# Patient Record
Sex: Female | Born: 2005 | Race: Black or African American | Hispanic: No | Marital: Single | Smoking: Never smoker
Health system: Southern US, Community
[De-identification: ages and names within clinical notes are randomized; demographics above are authoritative.]

## PROBLEM LIST (undated history)

## (undated) ENCOUNTER — Inpatient Hospital Stay (HOSPITAL_COMMUNITY): Payer: Self-pay

## (undated) DIAGNOSIS — J302 Other seasonal allergic rhinitis: Secondary | ICD-10-CM

## (undated) HISTORY — PX: MYRINGOTOMY: SUR874

---
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
## 2023-04-19 ENCOUNTER — Other Ambulatory Visit: Payer: Self-pay

## 2023-04-19 ENCOUNTER — Encounter (HOSPITAL_BASED_OUTPATIENT_CLINIC_OR_DEPARTMENT_OTHER): Payer: Self-pay

## 2023-04-19 ENCOUNTER — Emergency Department (HOSPITAL_BASED_OUTPATIENT_CLINIC_OR_DEPARTMENT_OTHER)
Admission: EM | Admit: 2023-04-19 | Discharge: 2023-04-20 | Disposition: A | Discharge status: Home / Self Care | Payer: Medicaid Other | Attending: Emergency Medicine | Admitting: Emergency Medicine

## 2023-04-19 DIAGNOSIS — K5900 Constipation, unspecified: Secondary | ICD-10-CM | POA: Diagnosis not present

## 2023-04-19 DIAGNOSIS — R112 Nausea with vomiting, unspecified: Secondary | ICD-10-CM | POA: Insufficient documentation

## 2023-04-19 LAB — URINALYSIS, ROUTINE W REFLEX MICROSCOPIC
Bilirubin Urine: NEGATIVE
Glucose, UA: NEGATIVE mg/dL
Hgb urine dipstick: NEGATIVE
Ketones, ur: NEGATIVE mg/dL
Leukocytes,Ua: NEGATIVE
Nitrite: NEGATIVE
Protein, ur: NEGATIVE mg/dL
Specific Gravity, Urine: 1.02 (ref 1.005–1.030)
pH: 7 (ref 5.0–8.0)

## 2023-04-19 LAB — COMPREHENSIVE METABOLIC PANEL
ALT: 16 U/L (ref 0–44)
AST: 21 U/L (ref 15–41)
Albumin: 4.1 g/dL (ref 3.5–5.0)
Alkaline Phosphatase: 79 U/L (ref 38–126)
Anion gap: 8 (ref 5–15)
BUN: 10 mg/dL (ref 6–20)
CO2: 24 mmol/L (ref 22–32)
Calcium: 9 mg/dL (ref 8.9–10.3)
Chloride: 102 mmol/L (ref 98–111)
Creatinine, Ser: 0.64 mg/dL (ref 0.44–1.00)
GFR, Estimated: >60 mL/min (ref >60)
Glucose, Bld: 85 mg/dL (ref 70–99)
Potassium: 3.4 mmol/L — ABNORMAL LOW (ref 3.5–5.1)
Sodium: 134 mmol/L — ABNORMAL LOW (ref 135–145)
Total Bilirubin: 0.2 mg/dL — ABNORMAL LOW (ref 0.3–1.2)
Total Protein: 8.1 g/dL (ref 6.5–8.1)

## 2023-04-19 LAB — LIPASE, BLOOD: Lipase: 27 U/L (ref 11–51)

## 2023-04-19 LAB — CBC
HCT: 35.6 % — ABNORMAL LOW (ref 36.0–46.0)
Hemoglobin: 11.1 g/dL — ABNORMAL LOW (ref 12.0–15.0)
MCH: 24.2 pg — ABNORMAL LOW (ref 26.0–34.0)
MCHC: 31.2 g/dL (ref 30.0–36.0)
MCV: 77.6 fL — ABNORMAL LOW (ref 80.0–100.0)
Platelets: 378 10*3/uL (ref 150–400)
RBC: 4.59 MIL/uL (ref 3.87–5.11)
RDW: 15.1 % (ref 11.5–15.5)
WBC: 9.7 10*3/uL (ref 4.0–10.5)
nRBC: 0 % (ref 0.0–0.2)

## 2023-04-19 LAB — PREGNANCY, URINE: Preg Test, Ur: NEGATIVE

## 2023-04-19 MED ORDER — ONDANSETRON 4 MG PO TBDP
4.0000 mg | ORAL_TABLET | Freq: Once | ORAL | Status: AC | PRN
  Administered 2023-04-20: 4 mg via ORAL
  Filled 2023-04-19: qty 1

## 2023-04-19 NOTE — ED Triage Notes (Signed)
Pt has had emesis x 4-5 and stated she had CP after vomiting.

## 2023-04-20 ENCOUNTER — Emergency Department (HOSPITAL_BASED_OUTPATIENT_CLINIC_OR_DEPARTMENT_OTHER): Payer: Medicaid Other

## 2023-04-20 MED ORDER — ONDANSETRON HCL 4 MG PO TABS: 4.0000 mg | ORAL_TABLET | Freq: Three times a day (TID) | ORAL | 0 refills | Status: DC | PRN

## 2023-04-20 MED ORDER — POLYETHYLENE GLYCOL 3350 17 G PO PACK: 17.0000 g | PACK | Freq: Every day | ORAL | 0 refills | Status: DC

## 2023-04-20 NOTE — ED Provider Notes (Signed)
[place] [facility] AT [facility] Provider Note   CSN: [id number] Arrival date & time: 04/19/23  2021     History  Chief Complaint  Patient presents with   Emesis    Morgan Mckenzie is a 17 y.o. female.  The history is provided by the patient.  Emesis Severity:  Moderate Duration:  1 day Timing:  Sporadic Quality:  Stomach contents Progression:  Unchanged Chronicity:  New Recent urination:  Normal Context: not post-tussive   Relieved by:  Nothing Worsened by:  Nothing Ineffective treatments:  None tried Associated symptoms: no abdominal pain, no diarrhea and no fever   Associated symptoms comment:  Constipation  Risk factors: no alcohol use        Home Medications Prior to Admission medications   Medication Sig Start Date End Date Taking? Authorizing Provider  ondansetron (ZOFRAN) 4 MG tablet Take 1 tablet (4 mg total) by mouth every 8 (eight) hours as needed for nausea or vomiting. 04/20/23  Yes Summerlynn Glauser, MD  polyethylene glycol (MIRALAX) 17 g packet Take 17 g by mouth daily. 04/20/23  Yes Kesi Perrow, MD  albuterol (PROVENTIL HFA;VENTOLIN HFA) 108 (90 BASE) MCG/ACT inhaler Inhale 2-4 puffs into the lungs every 6 (six) hours as needed. For shortness of breath and wheezing     [provider]  chlorpheniramine-HYDROcodone (TUSSIONEX PENNKINETIC ER) 10-8 MG/5ML SUER Take 5 mLs by mouth every 12 (twelve) hours as needed. 01/15/16   Molpus, John, MD  fluticasone (FLONASE) 50 MCG/ACT nasal spray Place 1 spray into the nose daily as needed. For seasonal allergies      [provider]  loratadine (CLARITIN) 5 MG/5ML syrup Take 10 mg by mouth daily.      [provider]  norethindrone (MICRONOR) 0.35 MG tablet Take 1 tablet by mouth daily. 05/08/22   [provider]      Allergies    Patient has no known allergies.    Review of Systems   Review of Systems  Constitutional:  Negative for fever.  HENT:   Negative for facial swelling.   Respiratory:  Negative for wheezing and stridor.   Gastrointestinal:  Positive for constipation and vomiting. Negative for abdominal pain and diarrhea.  Genitourinary:  Negative for dysuria and flank pain.  All other systems reviewed and are negative.   Physical Exam Updated Vital Signs BP 138/87   Pulse 94   Temp 99 F (37.2 C) (Oral)   Resp 18   Ht 5\' 3"  (1.6 m)   Wt 89.8 kg   LMP 03/20/2023   SpO2 100%   BMI 35.07 kg/m  Physical Exam Vitals and nursing note reviewed.  Constitutional:      General: She is not in acute distress.    Appearance: Normal appearance. She is well-developed.  HENT:     Head: Normocephalic and atraumatic.     Nose: Nose normal.  Eyes:     Pupils: Pupils are equal, round, and reactive to light.  Cardiovascular:     Rate and Rhythm: Normal rate and regular rhythm.     Pulses: Normal pulses.     Heart sounds: Normal heart sounds.  Pulmonary:     Effort: Pulmonary effort is normal. No respiratory distress.     Breath sounds: Normal breath sounds.  Abdominal:     General: Bowel sounds are normal. There is no distension.     Palpations: Abdomen is soft.     Tenderness: There is no abdominal tenderness. There is  no guarding or rebound.  Genitourinary:    Vagina: No vaginal discharge.  Musculoskeletal:        General: Normal range of motion.     Cervical back: Neck supple.  Skin:    General: Skin is dry.     Capillary Refill: Capillary refill takes less than 2 seconds.     Findings: No erythema or rash.  Neurological:     General: No focal deficit present.     Mental Status: She is alert.     Deep Tendon Reflexes: Reflexes normal.  Psychiatric:        Mood and Affect: Mood normal.     ED Results / Procedures / Treatments   Labs (all labs ordered are listed, but only abnormal results are displayed) Results for orders placed or performed during the hospital encounter of 04/19/23  Lipase, blood  Result  Value Ref Range   Lipase 27 11 - 51 U/L  Comprehensive metabolic panel  Result Value Ref Range   Sodium 134 (L) 135 - 145 mmol/L   Potassium 3.4 (L) 3.5 - 5.1 mmol/L   Chloride 102 98 - 111 mmol/L   CO2 24 22 - 32 mmol/L   Glucose, Bld 85 70 - 99 mg/dL   BUN 10 6 - 20 mg/dL   Creatinine, Ser 1.61 0.44 - 1.00 mg/dL   Calcium 9.0 8.9 - 09.6 mg/dL   Total Protein 8.1 6.5 - 8.1 g/dL   Albumin 4.1 3.5 - 5.0 g/dL   AST 21 15 - 41 U/L   ALT 16 0 - 44 U/L   Alkaline Phosphatase 79 38 - 126 U/L   Total Bilirubin 0.2 (L) 0.3 - 1.2 mg/dL   GFR, Estimated >04 >54 mL/min   Anion gap 8 5 - 15  CBC  Result Value Ref Range   WBC 9.7 4.0 - 10.5 K/uL   RBC 4.59 3.87 - 5.11 MIL/uL   Hemoglobin 11.1 (L) 12.0 - 15.0 g/dL   HCT 09.8 (L) 11.9 - 14.7 %   MCV 77.6 (L) 80.0 - 100.0 fL   MCH 24.2 (L) 26.0 - 34.0 pg   MCHC 31.2 30.0 - 36.0 g/dL   RDW 82.9 56.2 - 13.0 %   Platelets 378 150 - 400 K/uL   nRBC 0.0 0.0 - 0.2 %  Urinalysis, Routine w reflex microscopic -Urine, Clean Catch  Result Value Ref Range   Color, Urine YELLOW YELLOW   APPearance CLEAR CLEAR   Specific Gravity, Urine 1.020 1.005 - 1.030   pH 7.0 5.0 - 8.0   Glucose, UA NEGATIVE NEGATIVE mg/dL   Hgb urine dipstick NEGATIVE NEGATIVE   Bilirubin Urine NEGATIVE NEGATIVE   Ketones, ur NEGATIVE NEGATIVE mg/dL   Protein, ur NEGATIVE NEGATIVE mg/dL   Nitrite NEGATIVE NEGATIVE   Leukocytes,Ua NEGATIVE NEGATIVE  Pregnancy, urine  Result Value Ref Range   Preg Test, Ur NEGATIVE NEGATIVE   DG Abdomen 1 View  Result Date: 04/20/2023 CLINICAL DATA:  Abdominal pain constipation, nausea and vomiting EXAM: ABDOMEN - 1 VIEW COMPARISON:  Report from abdominal radiographs 06/26/2005 FINDINGS: The bowel gas pattern is normal. Moderate to large colonic stool load greatest in the right colon. No radio-opaque calculi or other significant radiographic abnormality are seen. IMPRESSION: Moderate to large colonic stool load greatest in the right colon.  Electronically Signed   By: Minerva Fester M.D.   On: 04/20/2023 01:19     EKG EKG Interpretation  Date/Time:  Saturday Apr 19 2023 20:29:11 EDT Ventricular  Rate:  90 PR Interval:  155 QRS Duration: 78 QT Interval:  331 QTC Calculation: 405 R Axis:   46 Text Interpretation: Sinus rhythm Confirmed by Vivien Rossetti ([id number]) on 04/19/2023 8:34:27 PM  Radiology DG Abdomen 1 View  Result Date: 04/20/2023 CLINICAL DATA:  Abdominal pain constipation, nausea and vomiting EXAM: ABDOMEN - 1 VIEW COMPARISON:  Report from abdominal radiographs 06/26/2005 FINDINGS: The bowel gas pattern is normal. Moderate to large colonic stool load greatest in the right colon. No radio-opaque calculi or other significant radiographic abnormality are seen. IMPRESSION: Moderate to large colonic stool load greatest in the right colon. Electronically Signed   By: Minerva Fester M.D.   On: 04/20/2023 01:19    Procedures Procedures    Medications Ordered in ED Medications  ondansetron (ZOFRAN-ODT) disintegrating tablet 4 mg (4 mg Oral Given 04/20/23 0009)    ED Course/ Medical Decision Making/ A&P                             Medical Decision Making Patient with vomiting and constipation   Amount and/or Complexity of Data Reviewed External Data Reviewed: notes.    Details: Previous notes reviewed  Labs: ordered.    Details: Urine is negative, pregnancy is negative, lipase normal 27, normal white count 9.7, low hemoglobin 11.1 normal platelets. Sodium 134, potassium 3.4 normal creatinine .64, normal LFTs Radiology: ordered and independent interpretation performed.    Details: Severe constipation by me on XR  Risk Prescription drug management. Risk Details: Exam vitals and labs benign and reassuring. Nausea medication given, no further Emesis.  PO challenged successfully in the ED will treat for nausea and constipation  RX for zofran and miralax.  BID miralax x 7 days then daily.      Final Clinical  Impression(s) / ED Diagnoses Final diagnoses:  Nausea and vomiting, unspecified vomiting type  Constipation, unspecified constipation type   Return for intractable cough, coughing up blood, fevers > 100.4 unrelieved by medication, shortness of breath, intractable vomiting, chest pain, shortness of breath, weakness, numbness, changes in speech, facial asymmetry, abdominal pain, passing out, Inability to tolerate liquids or food, cough, altered mental status or any concerns. No signs of systemic illness or infection. The patient is nontoxic-appearing on exam and vital signs are within normal limits.  I have reviewed the triage vital signs and the nursing notes. Pertinent labs & imaging results that were available during my care of the patient were reviewed by me and considered in my medical decision making (see chart for details). After history, exam, and medical workup I feel the patient has been appropriately medically screened and is safe for discharge home. Pertinent diagnoses were discussed with the patient. Patient was given return precautions.  Rx / DC Orders ED Discharge Orders          Ordered    ondansetron (ZOFRAN) 4 MG tablet  Every 8 hours PRN        04/20/23 0111    polyethylene glycol (MIRALAX) 17 g packet  Daily        04/20/23 0111              Skyanne Welle, MD 04/20/23 0136

## 2023-06-02 ENCOUNTER — Encounter (HOSPITAL_BASED_OUTPATIENT_CLINIC_OR_DEPARTMENT_OTHER): Payer: Self-pay | Admitting: Emergency Medicine

## 2023-06-02 ENCOUNTER — Other Ambulatory Visit: Payer: Self-pay

## 2023-06-02 ENCOUNTER — Emergency Department (HOSPITAL_BASED_OUTPATIENT_CLINIC_OR_DEPARTMENT_OTHER)
Admission: EM | Admit: 2023-06-02 | Discharge: 2023-06-02 | Disposition: A | Discharge status: Home / Self Care | Payer: Medicaid Other | Attending: Emergency Medicine | Admitting: Emergency Medicine

## 2023-06-02 DIAGNOSIS — N898 Other specified noninflammatory disorders of vagina: Secondary | ICD-10-CM | POA: Diagnosis present

## 2023-06-02 DIAGNOSIS — B3731 Acute candidiasis of vulva and vagina: Secondary | ICD-10-CM

## 2023-06-02 DIAGNOSIS — Z7951 Long term (current) use of inhaled steroids: Secondary | ICD-10-CM | POA: Insufficient documentation

## 2023-06-02 DIAGNOSIS — J45909 Unspecified asthma, uncomplicated: Secondary | ICD-10-CM | POA: Insufficient documentation

## 2023-06-02 LAB — URINALYSIS, W/ REFLEX TO CULTURE (INFECTION SUSPECTED)
Bilirubin Urine: NEGATIVE
Glucose, UA: NEGATIVE mg/dL
Hgb urine dipstick: NEGATIVE
Ketones, ur: 40 mg/dL — AB
Nitrite: NEGATIVE
Protein, ur: NEGATIVE mg/dL
Specific Gravity, Urine: >=1.03 (ref 1.005–1.030)
pH: 7 (ref 5.0–8.0)

## 2023-06-02 LAB — PREGNANCY, URINE: Preg Test, Ur: NEGATIVE

## 2023-06-02 LAB — WET PREP, GENITAL
Clue Cells Wet Prep HPF POC: NONE SEEN
Sperm: NONE SEEN
Trich, Wet Prep: NONE SEEN
WBC, Wet Prep HPF POC: >=10 — AB (ref <10)

## 2023-06-02 MED ORDER — FLUCONAZOLE 150 MG PO TABS: 150.0000 mg | ORAL_TABLET | Freq: Every day | ORAL | 0 refills | Status: DC

## 2023-06-02 NOTE — ED Provider Notes (Signed)
[place] [facility] Provider Note   CSN: [id number] Arrival date & time: 06/02/23  0254     History  Chief Complaint  Patient presents with   Vaginal Itching    Morgan Mckenzie is a 17 y.o. female.  The history is provided by the patient.  Vaginal Itching  Morgan Mckenzie is a 17 y.o. female who presents to the Emergency Department complaining of vaginal itching.  She presents to the emergency department for evaluation of 2 to 3 days of vaginal itching that she describes as uncomfortable feeling.  At times it burns.  No associated fever, abdominal pain, vomiting, dysuria, vaginal discharge.  No prior similar symptoms.  No new sexual partners.  She tried an over-the-counter urinary relief medication with minimal relief.  She does have some nausea.   Hx/o asthma.  Lmp 6/19.         Home Medications Prior to Admission medications   Medication Sig Start Date End Date Taking? Authorizing Provider  fluconazole (DIFLUCAN) 150 MG tablet Take 1 tablet (150 mg total) by mouth daily. 06/02/23  Yes Tilden Fossa, MD  albuterol (PROVENTIL HFA;VENTOLIN HFA) 108 (90 BASE) MCG/ACT inhaler Inhale 2-4 puffs into the lungs every 6 (six) hours as needed. For shortness of breath and wheezing     [provider]  chlorpheniramine-HYDROcodone (TUSSIONEX PENNKINETIC ER) 10-8 MG/5ML SUER Take 5 mLs by mouth every 12 (twelve) hours as needed. 01/15/16   Molpus, John, MD  fluticasone (FLONASE) 50 MCG/ACT nasal spray Place 1 spray into the nose daily as needed. For seasonal allergies      [provider]  loratadine (CLARITIN) 5 MG/5ML syrup Take 10 mg by mouth daily.      [provider]  norethindrone (MICRONOR) 0.35 MG tablet Take 1 tablet by mouth daily. 05/08/22   [provider]  ondansetron (ZOFRAN) 4 MG tablet Take 1 tablet (4 mg total) by mouth every 8 (eight) hours as needed for nausea or vomiting. 04/20/23   Palumbo, April, MD   polyethylene glycol (MIRALAX) 17 g packet Take 17 g by mouth daily. 04/20/23   Palumbo, April, MD      Allergies    Patient has no known allergies.    Review of Systems   Review of Systems  All other systems reviewed and are negative.   Physical Exam Updated Vital Signs BP (!) 134/91 (BP Location: Right Arm)   Pulse 83   Temp 99.1 F (37.3 C) (Oral)   Resp 16   Ht 5\' 3"  (1.6 m)   Wt 90.7 kg   LMP 05/21/2023 (Exact Date)   SpO2 100%   BMI 35.43 kg/m  Physical Exam Vitals and nursing note reviewed.  Constitutional:      Appearance: She is well-developed.  HENT:     Head: Normocephalic and atraumatic.  Cardiovascular:     Rate and Rhythm: Normal rate and regular rhythm.  Pulmonary:     Effort: Pulmonary effort is normal. No respiratory distress.  Abdominal:     Palpations: Abdomen is soft.     Tenderness: There is no abdominal tenderness. There is no guarding or rebound.  Genitourinary:    Comments: Small amount of thick white discharge.  No CMT.  No external vaginal lesions. Musculoskeletal:        General: No tenderness.  Skin:    General: Skin is warm and dry.  Neurological:     Mental Status: She is alert and oriented to person, place,  and time.  Psychiatric:        Behavior: Behavior normal.     ED Results / Procedures / Treatments   Labs (all labs ordered are listed, but only abnormal results are displayed) Labs Reviewed  WET PREP, GENITAL - Abnormal; Notable for the following components:      Result Value   Yeast Wet Prep HPF POC PRESENT (*)    WBC, Wet Prep HPF POC >=10 (*)    All other components within normal limits  URINALYSIS, W/ REFLEX TO CULTURE (INFECTION SUSPECTED) - Abnormal; Notable for the following components:   Ketones, ur 40 (*)    Leukocytes,Ua TRACE (*)    Bacteria, UA RARE (*)    All other components within normal limits  PREGNANCY, URINE  GC/CHLAMYDIA PROBE AMP ([place]) NOT AT [facility]    EKG None  Radiology No results  found.  Procedures Procedures    Medications Ordered in ED Medications - No data to display  ED Course/ Medical Decision Making/ A&P                             Medical Decision Making Amount and/or Complexity of Data Reviewed Labs: ordered.  Risk Prescription drug management.   Patient here for evaluation of vaginal itching for several days.  She does have a small amount of discharge on examination.  Examination is not consistent with cervicitis/PID.  UA is not consistent with UTI.  She is not pregnant.  Wet prep is significant for yeast.  Discussed with patient findings of candidiasis and treatment.  Also discussed safe sexual practices.  Plan to discharge with PCP follow-up and return precautions.        Final Clinical Impression(s) / ED Diagnoses Final diagnoses:  Vulvovaginal candidiasis    Rx / DC Orders ED Discharge Orders          Ordered    fluconazole (DIFLUCAN) 150 MG tablet  Daily        06/02/23 0440              Tilden Fossa, MD 06/02/23 [phone]

## 2023-06-02 NOTE — ED Triage Notes (Signed)
Pt to ED from home c/o vaginal itching x2-3 days, taking cranberry juice and UTI OTC medication with some relief but states still itching and sensitive to the touch.  Denies bleeding, painful urination, odor, or discharge.

## 2023-06-03 LAB — GC/CHLAMYDIA PROBE AMP (~~LOC~~) NOT AT ARMC
Chlamydia: NEGATIVE
Comment: NEGATIVE
Comment: NORMAL
Neisseria Gonorrhea: NEGATIVE

## 2023-06-19 ENCOUNTER — Other Ambulatory Visit: Payer: Self-pay

## 2023-06-19 ENCOUNTER — Encounter (HOSPITAL_BASED_OUTPATIENT_CLINIC_OR_DEPARTMENT_OTHER): Payer: Self-pay | Admitting: Emergency Medicine

## 2023-06-19 ENCOUNTER — Emergency Department (HOSPITAL_BASED_OUTPATIENT_CLINIC_OR_DEPARTMENT_OTHER)
Admission: EM | Admit: 2023-06-19 | Discharge: 2023-06-19 | Disposition: A | Discharge status: Home / Self Care | Payer: Medicaid Other | Attending: Emergency Medicine | Admitting: Emergency Medicine

## 2023-06-19 ENCOUNTER — Emergency Department (HOSPITAL_BASED_OUTPATIENT_CLINIC_OR_DEPARTMENT_OTHER): Payer: Medicaid Other

## 2023-06-19 DIAGNOSIS — J45909 Unspecified asthma, uncomplicated: Secondary | ICD-10-CM | POA: Insufficient documentation

## 2023-06-19 DIAGNOSIS — W228XXA Striking against or struck by other objects, initial encounter: Secondary | ICD-10-CM | POA: Insufficient documentation

## 2023-06-19 DIAGNOSIS — Z23 Encounter for immunization: Secondary | ICD-10-CM | POA: Insufficient documentation

## 2023-06-19 DIAGNOSIS — S91201A Unspecified open wound of right great toe with damage to nail, initial encounter: Secondary | ICD-10-CM | POA: Diagnosis not present

## 2023-06-19 DIAGNOSIS — Y9301 Activity, walking, marching and hiking: Secondary | ICD-10-CM | POA: Diagnosis not present

## 2023-06-19 DIAGNOSIS — S99921A Unspecified injury of right foot, initial encounter: Secondary | ICD-10-CM | POA: Diagnosis present

## 2023-06-19 DIAGNOSIS — S91209A Unspecified open wound of unspecified toe(s) with damage to nail, initial encounter: Secondary | ICD-10-CM

## 2023-06-19 MED ORDER — IBUPROFEN 800 MG PO TABS
800.0000 mg | ORAL_TABLET | Freq: Once | ORAL | Status: AC
  Administered 2023-06-19: 800 mg via ORAL
  Filled 2023-06-19: qty 1

## 2023-06-19 MED ORDER — ACETAMINOPHEN 325 MG PO TABS: 650.0000 mg | ORAL_TABLET | Freq: Four times a day (QID) | ORAL | 0 refills | Status: DC | PRN

## 2023-06-19 MED ORDER — IBUPROFEN 600 MG PO TABS: 600.0000 mg | ORAL_TABLET | Freq: Four times a day (QID) | ORAL | 0 refills | Status: DC | PRN

## 2023-06-19 MED ORDER — TETANUS-DIPHTH-ACELL PERTUSSIS 5-2.5-18.5 LF-MCG/0.5 IM SUSY
0.5000 mL | PREFILLED_SYRINGE | Freq: Once | INTRAMUSCULAR | Status: AC
  Administered 2023-06-19: 0.5 mL via INTRAMUSCULAR
  Filled 2023-06-19: qty 0.5

## 2023-06-19 NOTE — Discharge Instructions (Addendum)
It was a pleasure caring for you today in the emergency department.  Your toenail may fall off over the next week, please follow up with your pcp for a recheck of the toe. Keep the toe clean/dry until the wound heals. No swimming pools, lakes, streams, hot tubs, baths until the wound has healed to avoid infection.   Please return to the emergency department for any worsening or worrisome symptoms.

## 2023-06-19 NOTE — ED Triage Notes (Signed)
  Patient comes in with R big toe injury that happened around 0100.  Patient states she was drinking and celebrating outside, and accidentally kicked a tree root.  Patient states her R big toenail came all the way back and she pushed it back down.  Bleeding controlled at this time.  Took 500 mg tylenol before arrival.  Pain 9/10, throbbing.

## 2023-06-19 NOTE — ED Provider Notes (Signed)
[place] [facility] Provider Note  CSN: [id number] Arrival date & time: 06/19/23 5284  Chief Complaint(s) Toe Injury  HPI Morgan Mckenzie is a 17 y.o. female with past medical history as below, significant for asthma, seasonal allergies who presents to the ED with complaint of toe injury.  Patient reports she was walking outside open toed shoes, she kicked a tree root with her R hallux.  Feels this dislodged her toenail, significant pain to her left toe after the event.  Unsure last tetanus shot.  She has been ambulatory since the event but does have some pain to her hallux.  No other injury reported, did not fall, no head injury, no LOC, no numbness or tingling to extremities.  No thinners.  Did not clean the wound following the event.  Did take Tylenol prior to arrival.  Normocephalic prior to injury  Past Medical History Past Medical History:  Diagnosis Date   Asthma    Seasonal allergies    There are no problems to display for this patient.  Home Medication(s) Prior to Admission medications   Medication Sig Start Date End Date Taking? Authorizing Provider  acetaminophen (TYLENOL) 325 MG tablet Take 2 tablets (650 mg total) by mouth every 6 (six) hours as needed. 06/19/23  Yes Tanda Rockers A, DO  ibuprofen (ADVIL) 600 MG tablet Take 1 tablet (600 mg total) by mouth every 6 (six) hours as needed. 06/19/23  Yes Tanda Rockers A, DO  albuterol (PROVENTIL HFA;VENTOLIN HFA) 108 (90 BASE) MCG/ACT inhaler Inhale 2-4 puffs into the lungs every 6 (six) hours as needed. For shortness of breath and wheezing     [provider]  chlorpheniramine-HYDROcodone (TUSSIONEX PENNKINETIC ER) 10-8 MG/5ML SUER Take 5 mLs by mouth every 12 (twelve) hours as needed. 01/15/16   Molpus, John, MD  fluconazole (DIFLUCAN) 150 MG tablet Take 1 tablet (150 mg total) by mouth daily. 06/02/23   Tilden Fossa, MD  fluticasone [facility]) 50 MCG/ACT nasal spray Place 1 spray into  the nose daily as needed. For seasonal allergies      [provider]  loratadine (CLARITIN) 5 MG/5ML syrup Take 10 mg by mouth daily.      [provider]  norethindrone (MICRONOR) 0.35 MG tablet Take 1 tablet by mouth daily. 05/08/22   [provider]  ondansetron (ZOFRAN) 4 MG tablet Take 1 tablet (4 mg total) by mouth every 8 (eight) hours as needed for nausea or vomiting. 04/20/23   Palumbo, April, MD  polyethylene glycol (MIRALAX) 17 g packet Take 17 g by mouth daily. 04/20/23   Palumbo, April, MD                                                                                                                                    Past Surgical History Past Surgical History:  Procedure Laterality Date   MYRINGOTOMY     Family  History History reviewed. No pertinent family history.  Social History Social History   Tobacco Use   Smoking status: Never    Passive exposure: Yes  Substance Use Topics   Alcohol use: Never   Drug use: Never   Allergies Patient has no known allergies.  Review of Systems Review of Systems  Respiratory:  Negative for shortness of breath.   Cardiovascular:  Negative for chest pain.  Gastrointestinal:  Negative for abdominal pain, nausea and vomiting.  Musculoskeletal:  Positive for arthralgias.  Skin:  Positive for wound.  Neurological:  Negative for syncope and headaches.  All other systems reviewed and are negative.   Physical Exam Vital Signs  I have reviewed the triage vital signs BP (!) 135/119 (BP Location: Right Arm)   Pulse (!) 107   Temp 98.1 F (36.7 C) (Oral)   Resp 20   Ht 5\' 3"  (1.6 m)   Wt 90.7 kg   LMP 06/19/2023 (Exact Date)   SpO2 100%   BMI 35.43 kg/m  Physical Exam Vitals and nursing note reviewed.  Constitutional:      General: She is not in acute distress.    Appearance: Normal appearance. She is well-developed. She is obese. She is not ill-appearing.  HENT:     Head: Normocephalic and  atraumatic.     Right Ear: External ear normal.     Left Ear: External ear normal.     Nose: Nose normal.     Mouth/Throat:     Mouth: Mucous membranes are moist.  Eyes:     General: No scleral icterus.       Right eye: No discharge.        Left eye: No discharge.  Cardiovascular:     Rate and Rhythm: Normal rate.  Pulmonary:     Effort: Pulmonary effort is normal. No respiratory distress.     Breath sounds: No stridor.  Abdominal:     General: Abdomen is flat. There is no distension.     Tenderness: There is no guarding.  Musculoskeletal:        General: No deformity.     Cervical back: No rigidity.       Feet:  Feet:     Comments: Partial avulsion of right hallux Toe is TTP No frank bleeding, does appear to be some dirt under the toenail Cap refill is brisk distal to injury DP brisk No laceration Skin:    General: Skin is warm and dry.     Coloration: Skin is not cyanotic, jaundiced or pale.  Neurological:     Mental Status: She is alert and oriented to person, place, and time.     GCS: GCS eye subscore is 4. GCS verbal subscore is 5. GCS motor subscore is 6.  Psychiatric:        Speech: Speech normal.        Behavior: Behavior normal. Behavior is cooperative.     ED Results and Treatments Labs (all labs ordered are listed, but only abnormal results are displayed) Labs Reviewed - No data to display  Radiology DG Toe Great Right  Result Date: 06/19/2023 CLINICAL DATA:  Big toe injury affecting the toenail. EXAM: RIGHT GREAT TOE three views. COMPARISON:  None Available. FINDINGS: Up lifted toenail with subjacent gas. No underlying fracture or opaque foreign body. IMPRESSION: Nail injury without fracture or opaque foreign body. Electronically Signed   By: Tiburcio Pea M.D.   On: 06/19/2023 05:58    Pertinent labs & imaging results that were  available during my care of the patient were reviewed by me and considered in my medical decision making (see MDM for details).  Medications Ordered in ED Medications  Tdap (BOOSTRIX) injection 0.5 mL (has no administration in time range)  ibuprofen (ADVIL) tablet 800 mg (800 mg Oral Given 06/19/23 0438)                                                                                                                                     Procedures Procedures  (including critical care time)  Medical Decision Making / ED Course    Medical Decision Making:    Morgan Mckenzie is a 17 y.o. female history as above including asthma here with toe injury. The complaint involves an extensive differential diagnosis and also carries with it a high risk of complications and morbidity.  Serious etiology was considered. Ddx includes but is not limited to: Fracture, abrasion, soft tissue injury, toenail avulsion, etc.  Complete initial physical exam performed, notably the patient  was NAD, sitting upright on stretcher with friend.    Reviewed and confirmed nursing documentation for past medical history, family history, social history.  Vital signs reviewed.    Clinical Course as of 06/19/23 1610  Thu Jun 19, 2023  9604 Give Motrin, update tetanus, clean wound, get x-ray. [SG]  0602 Xr w/o fx, agree w/ radiologist  [SG]    Clinical Course User Index [SG] Sloan Leiter, DO   She has toenail partial avulsion, no fx Wound care ordered, update tetanus Post op shoe Close pcp f/u, may need to remove toenail, defer to pcp  Wound care instructions provided to pt along with strict return precautions  The patient improved significantly and was discharged in stable condition. Detailed discussions were had with the patient regarding current findings, and need for close f/u with PCP or on call doctor. The patient has been instructed to return immediately if the symptoms worsen in any way for re-evaluation.  Patient verbalized understanding and is in agreement with current care plan. All questions answered prior to discharge.         Additional history obtained: -Additional history obtained from friend -External records from outside source obtained and reviewed including: Chart review including previous notes, labs, imaging, consultation notes including prior ED visits, prior labs and imaging, home medications   Lab Tests: N/A  EKG   EKG Interpretation Date/Time:    Ventricular Rate:    PR Interval:    QRS  Duration:    QT Interval:    QTC Calculation:   R Axis:      Text Interpretation:           Imaging Studies ordered: I ordered imaging studies including toe x-ray I independently visualized the following imaging with scope of interpretation limited to determining acute life threatening conditions related to emergency care; findings noted above, significant for no fx, agree w/ rads I independently visualized and interpreted imaging. I agree with the radiologist interpretation   Medicines ordered and prescription drug management: Meds ordered this encounter  Medications   Tdap (BOOSTRIX) injection 0.5 mL   ibuprofen (ADVIL) tablet 800 mg   ibuprofen (ADVIL) 600 MG tablet    Sig: Take 1 tablet (600 mg total) by mouth every 6 (six) hours as needed.    Dispense:  30 tablet    Refill:  0   acetaminophen (TYLENOL) 325 MG tablet    Sig: Take 2 tablets (650 mg total) by mouth every 6 (six) hours as needed.    Dispense:  36 tablet    Refill:  0    -I have reviewed the patients home medicines and have made adjustments as needed   Consultations Obtained: N/A  Cardiac Monitoring: Not applicable  Social Determinants of Health:  Diagnosis or treatment significantly limited by social determinants of health: obesity, former smoker   Reevaluation: After the interventions noted above, I reevaluated the patient and found that they have improved  Co morbidities that  complicate the patient evaluation  Past Medical History:  Diagnosis Date   Asthma    Seasonal allergies       Dispostion: Disposition decision including need for hospitalization was considered, and patient discharged from emergency department.    Final Clinical Impression(s) / ED Diagnoses Final diagnoses:  Avulsion of toenail, initial encounter     This chart was dictated using voice recognition software.  Despite best efforts to proofread,  errors can occur which can change the documentation meaning.    Sloan Leiter, DO 06/19/23 [phone]

## 2023-08-14 ENCOUNTER — Other Ambulatory Visit: Payer: Self-pay

## 2023-08-14 ENCOUNTER — Encounter (HOSPITAL_BASED_OUTPATIENT_CLINIC_OR_DEPARTMENT_OTHER): Payer: Self-pay | Admitting: Emergency Medicine

## 2023-08-14 DIAGNOSIS — R109 Unspecified abdominal pain: Secondary | ICD-10-CM | POA: Diagnosis present

## 2023-08-14 DIAGNOSIS — R112 Nausea with vomiting, unspecified: Secondary | ICD-10-CM | POA: Insufficient documentation

## 2023-08-14 DIAGNOSIS — R1031 Right lower quadrant pain: Secondary | ICD-10-CM | POA: Insufficient documentation

## 2023-08-14 LAB — COMPREHENSIVE METABOLIC PANEL
ALT: 11 U/L (ref 0–44)
AST: 17 U/L (ref 15–41)
Albumin: 3.7 g/dL (ref 3.5–5.0)
Alkaline Phosphatase: 66 U/L (ref 38–126)
Anion gap: 9 (ref 5–15)
BUN: 11 mg/dL (ref 6–20)
CO2: 23 mmol/L (ref 22–32)
Calcium: 8.9 mg/dL (ref 8.9–10.3)
Chloride: 105 mmol/L (ref 98–111)
Creatinine, Ser: 0.72 mg/dL (ref 0.44–1.00)
GFR, Estimated: >60 mL/min (ref >60)
Glucose, Bld: 115 mg/dL — ABNORMAL HIGH (ref 70–99)
Potassium: 3.6 mmol/L (ref 3.5–5.1)
Sodium: 137 mmol/L (ref 135–145)
Total Bilirubin: 0.2 mg/dL — ABNORMAL LOW (ref 0.3–1.2)
Total Protein: 7.5 g/dL (ref 6.5–8.1)

## 2023-08-14 LAB — CBC
HCT: 33.4 % — ABNORMAL LOW (ref 36.0–46.0)
Hemoglobin: 10.5 g/dL — ABNORMAL LOW (ref 12.0–15.0)
MCH: 24.2 pg — ABNORMAL LOW (ref 26.0–34.0)
MCHC: 31.4 g/dL (ref 30.0–36.0)
MCV: 77 fL — ABNORMAL LOW (ref 80.0–100.0)
Platelets: 374 10*3/uL (ref 150–400)
RBC: 4.34 MIL/uL (ref 3.87–5.11)
RDW: 15 % (ref 11.5–15.5)
WBC: 8.8 10*3/uL (ref 4.0–10.5)
nRBC: 0 % (ref 0.0–0.2)

## 2023-08-14 LAB — URINALYSIS, ROUTINE W REFLEX MICROSCOPIC
Glucose, UA: NEGATIVE mg/dL
Hgb urine dipstick: NEGATIVE
Ketones, ur: NEGATIVE mg/dL
Leukocytes,Ua: NEGATIVE
Nitrite: NEGATIVE
Protein, ur: NEGATIVE mg/dL
Specific Gravity, Urine: >=1.03 (ref 1.005–1.030)
pH: 5.5 (ref 5.0–8.0)

## 2023-08-14 LAB — LIPASE, BLOOD: Lipase: 23 U/L (ref 11–51)

## 2023-08-14 LAB — PREGNANCY, URINE: Preg Test, Ur: NEGATIVE

## 2023-08-14 NOTE — ED Triage Notes (Signed)
Lower right side cramping, has tried  OTC meds with no relief. Denies injury or Hx of same

## 2023-08-15 ENCOUNTER — Ambulatory Visit (HOSPITAL_BASED_OUTPATIENT_CLINIC_OR_DEPARTMENT_OTHER): Payer: Medicaid Other

## 2023-08-15 ENCOUNTER — Emergency Department (HOSPITAL_BASED_OUTPATIENT_CLINIC_OR_DEPARTMENT_OTHER)
Admission: EM | Admit: 2023-08-15 | Discharge: 2023-08-15 | Disposition: A | Discharge status: Home / Self Care | Payer: Medicaid Other | Attending: Emergency Medicine | Admitting: Emergency Medicine

## 2023-08-15 ENCOUNTER — Emergency Department (HOSPITAL_BASED_OUTPATIENT_CLINIC_OR_DEPARTMENT_OTHER): Payer: Medicaid Other

## 2023-08-15 ENCOUNTER — Encounter (HOSPITAL_BASED_OUTPATIENT_CLINIC_OR_DEPARTMENT_OTHER): Payer: Self-pay | Admitting: Radiology

## 2023-08-15 ENCOUNTER — Ambulatory Visit (HOSPITAL_BASED_OUTPATIENT_CLINIC_OR_DEPARTMENT_OTHER)
Admission: RE | Admit: 2023-08-15 | Discharge: 2023-08-15 | Disposition: A | Discharge status: Home / Self Care | Payer: Medicaid Other | Source: Ambulatory Visit | Attending: Emergency Medicine | Admitting: Emergency Medicine

## 2023-08-15 DIAGNOSIS — R1031 Right lower quadrant pain: Secondary | ICD-10-CM

## 2023-08-15 MED ORDER — MORPHINE SULFATE (PF) 4 MG/ML IV SOLN
4.0000 mg | Freq: Once | INTRAVENOUS | Status: AC
  Administered 2023-08-15: 4 mg via INTRAVENOUS
  Filled 2023-08-15: qty 1

## 2023-08-15 MED ORDER — ONDANSETRON 4 MG PO TBDP: 4.0000 mg | ORAL_TABLET | Freq: Three times a day (TID) | ORAL | 0 refills | Status: DC | PRN

## 2023-08-15 MED ORDER — IOHEXOL 300 MG/ML  SOLN
100.0000 mL | Freq: Once | INTRAMUSCULAR | Status: AC | PRN
  Administered 2023-08-15: 100 mL via INTRAVENOUS

## 2023-08-15 MED ORDER — ONDANSETRON HCL 4 MG/2ML IJ SOLN
4.0000 mg | Freq: Once | INTRAMUSCULAR | Status: AC
  Administered 2023-08-15: 4 mg via INTRAVENOUS
  Filled 2023-08-15: qty 2

## 2023-08-15 MED ORDER — SODIUM CHLORIDE 0.9 % IV BOLUS
1000.0000 mL | Freq: Once | INTRAVENOUS | Status: AC
  Administered 2023-08-15: 1000 mL via INTRAVENOUS

## 2023-08-15 NOTE — ED Provider Notes (Signed)
[facility]PARTMENT AT [facility] Provider Note   CSN: [id number] Arrival date & time: 08/14/23  2305     History  Chief Complaint  Patient presents with   Abdominal Pain    Morgan Mckenzie is a 17 y.o. female.  Patient with right sided abdominal pain worsening for the past 2 days.  Associate with nausea but no vomiting.  No constipation.  No pain with urination or blood in the urine.  Nothing makes the pain better.  Worse with pushing on the area.  Poor appetite.  No chest pain or shortness of breath.  No cough, runny nose or sore throat.  No fever.  No pain with urination or blood in the urine.  Still has appendix and gallbladder. No vaginal bleeding or discharge. No history of similar pain in the past.  No injury.  The history is provided by the patient.  Abdominal Pain Associated symptoms: nausea and vomiting   Associated symptoms: no cough, no dysuria, no fever, no hematuria and no shortness of breath        Home Medications Prior to Admission medications   Medication Sig Start Date End Date Taking? Authorizing Provider  acetaminophen (TYLENOL) 325 MG tablet Take 2 tablets (650 mg total) by mouth every 6 (six) hours as needed. 06/19/23   Sloan Leiter, DO  albuterol (PROVENTIL HFA;VENTOLIN HFA) 108 (90 BASE) MCG/ACT inhaler Inhale 2-4 puffs into the lungs every 6 (six) hours as needed. For shortness of breath and wheezing     [provider]  chlorpheniramine-HYDROcodone (TUSSIONEX PENNKINETIC ER) 10-8 MG/5ML SUER Take 5 mLs by mouth every 12 (twelve) hours as needed. 01/15/16   Molpus, John, MD  fluconazole (DIFLUCAN) 150 MG tablet Take 1 tablet (150 mg total) by mouth daily. 06/02/23   Tilden Fossa, MD  fluticasone [facility]) 50 MCG/ACT nasal spray Place 1 spray into the nose daily as needed. For seasonal allergies      [provider]  ibuprofen (ADVIL) 600 MG tablet Take 1 tablet (600 mg total) by mouth every 6 (six) hours as  needed. 06/19/23   Sloan Leiter, DO  loratadine (CLARITIN) 5 MG/5ML syrup Take 10 mg by mouth daily.      [provider]  norethindrone (MICRONOR) 0.35 MG tablet Take 1 tablet by mouth daily. 05/08/22   [provider]  ondansetron (ZOFRAN) 4 MG tablet Take 1 tablet (4 mg total) by mouth every 8 (eight) hours as needed for nausea or vomiting. 04/20/23   Palumbo, April, MD  polyethylene glycol (MIRALAX) 17 g packet Take 17 g by mouth daily. 04/20/23   Palumbo, April, MD      Allergies    Patient has no known allergies.    Review of Systems   Review of Systems  Constitutional:  Positive for activity change and appetite change. Negative for fever.  HENT:  Negative for congestion and rhinorrhea.   Respiratory:  Negative for cough and shortness of breath.   Gastrointestinal:  Positive for abdominal pain, nausea and vomiting.  Genitourinary:  Negative for dysuria and hematuria.  Musculoskeletal:  Negative for arthralgias, back pain and myalgias.  Skin:  Negative for rash.  Neurological:  Negative for dizziness, weakness and headaches.   all other systems are negative except as noted in the HPI and PMH.    Physical Exam Updated Vital Signs BP 128/66 (BP Location: Right Arm)   Pulse 94   Temp 98.8 F (37.1 C) (Oral)   Resp 14  Ht 5\' 3"  (1.6 m)   Wt 90.7 kg   LMP 07/24/2023 (Approximate)   SpO2 100%   BMI 35.43 kg/m  Physical Exam Vitals and nursing note reviewed.  Constitutional:      General: She is not in acute distress.    Appearance: She is well-developed.  HENT:     Head: Normocephalic and atraumatic.     Mouth/Throat:     Pharynx: No oropharyngeal exudate.  Eyes:     Conjunctiva/sclera: Conjunctivae normal.     Pupils: Pupils are equal, round, and reactive to light.  Neck:     Comments: No meningismus. Cardiovascular:     Rate and Rhythm: Normal rate and regular rhythm.     Heart sounds: Normal heart sounds. No murmur heard. Pulmonary:     Effort:  Pulmonary effort is normal. No respiratory distress.     Breath sounds: Normal breath sounds.  Abdominal:     Palpations: Abdomen is soft.     Tenderness: There is abdominal tenderness. There is guarding. There is no rebound.     Comments: Tender right lower quadrant with voluntary guarding.  Musculoskeletal:        General: No tenderness. Normal range of motion.     Cervical back: Normal range of motion and neck supple.  Skin:    General: Skin is warm.  Neurological:     Mental Status: She is alert and oriented to person, place, and time.     Cranial Nerves: No cranial nerve deficit.     Motor: No abnormal muscle tone.     Coordination: Coordination normal.     Comments:  5/5 strength throughout. CN 2-12 intact.Equal grip strength.   Psychiatric:        Behavior: Behavior normal.     ED Results / Procedures / Treatments   Labs (all labs ordered are listed, but only abnormal results are displayed) Labs Reviewed  COMPREHENSIVE METABOLIC PANEL - Abnormal; Notable for the following components:      Result Value   Glucose, Bld 115 (*)    Total Bilirubin 0.2 (*)    All other components within normal limits  CBC - Abnormal; Notable for the following components:   Hemoglobin 10.5 (*)    HCT 33.4 (*)    MCV 77.0 (*)    MCH 24.2 (*)    All other components within normal limits  URINALYSIS, ROUTINE W REFLEX MICROSCOPIC - Abnormal; Notable for the following components:   APPearance HAZY (*)    Bilirubin Urine SMALL (*)    All other components within normal limits  LIPASE, BLOOD  PREGNANCY, URINE    EKG None  Radiology CT ABDOMEN PELVIS W CONTRAST  Result Date: 08/15/2023 CLINICAL DATA:  Right lower quadrant abdominal pain EXAM: CT ABDOMEN AND PELVIS WITH CONTRAST TECHNIQUE: Multidetector CT imaging of the abdomen and pelvis was performed using the standard protocol following bolus administration of intravenous contrast. RADIATION DOSE REDUCTION: This exam was performed  according to the departmental dose-optimization program which includes automated exposure control, adjustment of the mA and/or kV according to patient size and/or use of iterative reconstruction technique. CONTRAST:  OMNIPAQUE IOHEXOL 300 MG/ML  SOLN COMPARISON:  None Available. FINDINGS: Lower chest: Right lower quadrant abdominal pain Hepatobiliary: Heterogeneous attenuation of the liver with poorly circumscribed hypoenhancement of segment 4 likely relates to geographic fatty hepatic infiltration. Superimposed small focus of hyperenhancement within segment 4 abutting the gallbladder fossa at axial image # 21/301 likely represents a small flash fill  hemangioma or area of vascular shunting. The liver is otherwise unremarkable. Gallbladder unremarkable. No intra or extrahepatic biliary ductal dilation. Pancreas: Unremarkable Spleen: Unremarkable Adrenals/Urinary Tract: Adrenal glands are unremarkable. Kidneys are normal, without renal calculi, focal lesion, or hydronephrosis. Bladder is unremarkable. Stomach/Bowel: Stomach is within normal limits. Appendix appears normal. No evidence of bowel wall thickening, distention, or inflammatory changes. Vascular/Lymphatic: No significant vascular findings are present. No enlarged abdominal or pelvic lymph nodes. Reproductive: Uterus and bilateral adnexa are unremarkable. Other: No abdominal wall hernia or abnormality. No abdominopelvic ascites. Musculoskeletal: No acute or significant osseous findings. IMPRESSION: 1. No acute intra-abdominal pathology identified. Normal appendix. No definite radiographic explanation for the patient's reported symptoms. 2. Heterogeneous attenuation of the liver with poorly circumscribed hypoenhancement of segment 4 likely relates to geographic fatty hepatic infiltration. Superimposed small focus of hyperenhancement within segment 4 abutting the gallbladder fossa likely represents a small flash fill hemangioma or area of vascular  shunting. If desired, this could be confirmed with nonemergent, outpatient hepatic protocol MRI examination. Electronically Signed   By: Helyn Numbers M.D.   On: 08/15/2023 01:56    Procedures Procedures    Medications Ordered in ED Medications  sodium chloride 0.9 % bolus 1,000 mL (has no administration in time range)  ondansetron (ZOFRAN) injection 4 mg (has no administration in time range)  morphine (PF) 4 MG/ML injection 4 mg (has no administration in time range)    ED Course/ Medical Decision Making/ A&P                                 Medical Decision Making Amount and/or Complexity of Data Reviewed Labs: ordered. Decision-making details documented in ED Course. Radiology: ordered and independent interpretation performed. Decision-making details documented in ED Course. ECG/medicine tests: ordered and independent interpretation performed. Decision-making details documented in ED Course.  Risk Prescription drug management.   2 days of right-sided lower abdominal tenderness.  Nausea but no vomiting.  Vital stable, no distress, abdomen soft without peritoneal signs.  hCG is negative.  Urinalysis is negative No urinary or vaginal symptoms.  Labs show stable anemia.  CT scan is obtained to assess for appendicitis.  This shows normal appendix and normal ovary.  Does show apparent vascular abnormality of liver which patient is made aware of with need for MRI follow-up.  Will have patient return later today for an ultrasound of her right ovary.  Low suspicion for ovarian torsion at this time as she is very comfortable and smiling on the phone.  Discussed appendicitis may not show up on CT scan right away.  She should return to the ED with worsening right-sided lower abdominal pain, fever or vomiting.  Return later today for ultrasound of ovary.  Return sooner with worsening pain, fever, vomiting, unable to eat or drink or any other concerns.       Final Clinical  Impression(s) / ED Diagnoses Final diagnoses:  RLQ abdominal pain    Rx / DC Orders ED Discharge Orders     None         Angelica Wix, Jeannett Senior, MD 08/15/23 [phone]

## 2023-08-15 NOTE — ED Notes (Signed)
Patient tolerated fluid challenge without complaints.

## 2023-08-15 NOTE — Discharge Instructions (Addendum)
Your testing today shows a normal appendix.  CT scan did show an abnormality of your liver which is likely a normal vascular variant but your doctor should schedule an MRI of your liver for further evaluation.  Return later today for an ultrasound of your ovaries to assess for ovarian cyst or blood supply problems to your right ovary. As we discussed, appendicitis may not show up on CT scan right away.  Return to the ED for worsening lower abdominal pain, fever, vomiting, not able to eat or drink or other concerns.

## 2023-08-29 ENCOUNTER — Encounter (HOSPITAL_BASED_OUTPATIENT_CLINIC_OR_DEPARTMENT_OTHER): Payer: Self-pay

## 2023-08-29 ENCOUNTER — Emergency Department (HOSPITAL_BASED_OUTPATIENT_CLINIC_OR_DEPARTMENT_OTHER)
Admission: EM | Admit: 2023-08-29 | Discharge: 2023-08-29 | Disposition: A | Discharge status: Home / Self Care | Payer: Medicaid Other | Attending: Emergency Medicine | Admitting: Emergency Medicine

## 2023-08-29 ENCOUNTER — Other Ambulatory Visit: Payer: Self-pay

## 2023-08-29 DIAGNOSIS — M898X8 Other specified disorders of bone, other site: Secondary | ICD-10-CM

## 2023-08-29 DIAGNOSIS — M7621 Iliac crest spur, right hip: Secondary | ICD-10-CM | POA: Diagnosis not present

## 2023-08-29 DIAGNOSIS — J45909 Unspecified asthma, uncomplicated: Secondary | ICD-10-CM | POA: Diagnosis not present

## 2023-08-29 DIAGNOSIS — R1031 Right lower quadrant pain: Secondary | ICD-10-CM | POA: Diagnosis not present

## 2023-08-29 DIAGNOSIS — E876 Hypokalemia: Secondary | ICD-10-CM | POA: Insufficient documentation

## 2023-08-29 DIAGNOSIS — R109 Unspecified abdominal pain: Secondary | ICD-10-CM | POA: Diagnosis present

## 2023-08-29 LAB — CBC
HCT: 32.2 % — ABNORMAL LOW (ref 36.0–46.0)
Hemoglobin: 10 g/dL — ABNORMAL LOW (ref 12.0–15.0)
MCH: 24.1 pg — ABNORMAL LOW (ref 26.0–34.0)
MCHC: 31.1 g/dL (ref 30.0–36.0)
MCV: 77.6 fL — ABNORMAL LOW (ref 80.0–100.0)
Platelets: 362 10*3/uL (ref 150–400)
RBC: 4.15 MIL/uL (ref 3.87–5.11)
RDW: 15.5 % (ref 11.5–15.5)
WBC: 7.5 10*3/uL (ref 4.0–10.5)
nRBC: 0 % (ref 0.0–0.2)

## 2023-08-29 LAB — COMPREHENSIVE METABOLIC PANEL
ALT: 12 U/L (ref 0–44)
AST: 19 U/L (ref 15–41)
Albumin: 3.7 g/dL (ref 3.5–5.0)
Alkaline Phosphatase: 63 U/L (ref 38–126)
Anion gap: 8 (ref 5–15)
BUN: 11 mg/dL (ref 6–20)
CO2: 23 mmol/L (ref 22–32)
Calcium: 8.6 mg/dL — ABNORMAL LOW (ref 8.9–10.3)
Chloride: 105 mmol/L (ref 98–111)
Creatinine, Ser: 0.69 mg/dL (ref 0.44–1.00)
GFR, Estimated: >60 mL/min (ref >60)
Glucose, Bld: 95 mg/dL (ref 70–99)
Potassium: 3.2 mmol/L — ABNORMAL LOW (ref 3.5–5.1)
Sodium: 136 mmol/L (ref 135–145)
Total Bilirubin: 0.4 mg/dL (ref 0.3–1.2)
Total Protein: 7.2 g/dL (ref 6.5–8.1)

## 2023-08-29 LAB — PREGNANCY, URINE: Preg Test, Ur: NEGATIVE

## 2023-08-29 LAB — URINALYSIS, ROUTINE W REFLEX MICROSCOPIC
Bilirubin Urine: NEGATIVE
Glucose, UA: NEGATIVE mg/dL
Ketones, ur: NEGATIVE mg/dL
Leukocytes,Ua: NEGATIVE
Nitrite: NEGATIVE
Protein, ur: 100 mg/dL — AB
Specific Gravity, Urine: >=1.03 (ref 1.005–1.030)
pH: 6 (ref 5.0–8.0)

## 2023-08-29 LAB — URINALYSIS, MICROSCOPIC (REFLEX)
Bacteria, UA: NONE SEEN
RBC / HPF: >50 RBC/hpf (ref 0–5)

## 2023-08-29 LAB — LIPASE, BLOOD: Lipase: 26 U/L (ref 11–51)

## 2023-08-29 MED ORDER — POTASSIUM CHLORIDE CRYS ER 20 MEQ PO TBCR
20.0000 meq | EXTENDED_RELEASE_TABLET | Freq: Once | ORAL | Status: AC
  Administered 2023-08-29: 20 meq via ORAL
  Filled 2023-08-29: qty 1

## 2023-08-29 MED ORDER — KETOROLAC TROMETHAMINE 60 MG/2ML IM SOLN
60.0000 mg | Freq: Once | INTRAMUSCULAR | Status: AC
  Administered 2023-08-29: 60 mg via INTRAMUSCULAR
  Filled 2023-08-29: qty 2

## 2023-08-29 NOTE — ED Provider Notes (Signed)
[place] [facility] Provider Note   CSN: [id number] Arrival date & time: 08/29/23  2048     History {Add pertinent medical, surgical, social history, OB history to HPI:1} Chief Complaint  Patient presents with   Abdominal Pain    Anisha Starliper is a 17 y.o. female.  The history is provided by the patient and medical records.  Abdominal Pain Shunte Senseney is a 17 y.o. female who presents to the Emergency Department complaining of *** AP, three weeks ago. Saw pcp Rx muslce relaxer and iron. RLQ over bony. Sharp. Ceom and goes, worse with moving. Spreads to back.  No fever, n/v/dyrusia, vaginal discharge, weakness, numbness  Hx/o asthma Lmp 2 days ago No injuries.      Home Medications Prior to Admission medications   Medication Sig Start Date End Date Taking? Authorizing Provider  acetaminophen (TYLENOL) 325 MG tablet Take 2 tablets (650 mg total) by mouth every 6 (six) hours as needed. 06/19/23   Sloan Leiter, DO  albuterol (PROVENTIL HFA;VENTOLIN HFA) 108 (90 BASE) MCG/ACT inhaler Inhale 2-4 puffs into the lungs every 6 (six) hours as needed. For shortness of breath and wheezing     [provider]  chlorpheniramine-HYDROcodone (TUSSIONEX PENNKINETIC ER) 10-8 MG/5ML SUER Take 5 mLs by mouth every 12 (twelve) hours as needed. 01/15/16   Molpus, John, MD  fluconazole (DIFLUCAN) 150 MG tablet Take 1 tablet (150 mg total) by mouth daily. 06/02/23   Tilden Fossa, MD  fluticasone [facility] 50 MCG/ACT nasal spray Place 1 spray into the nose daily as needed. For seasonal allergies      [provider]  ibuprofen (ADVIL) 600 MG tablet Take 1 tablet (600 mg total) by mouth every 6 (six) hours as needed. 06/19/23   Sloan Leiter, DO  loratadine (CLARITIN) 5 MG/5ML syrup Take 10 mg by mouth daily.      [provider]  norethindrone (MICRONOR) 0.35 MG tablet Take 1 tablet by mouth daily. 05/08/22   [provider]   ondansetron (ZOFRAN) 4 MG tablet Take 1 tablet (4 mg total) by mouth every 8 (eight) hours as needed for nausea or vomiting. 04/20/23   Palumbo, April, MD  ondansetron (ZOFRAN-ODT) 4 MG disintegrating tablet Take 1 tablet (4 mg total) by mouth every 8 (eight) hours as needed. 08/15/23   Rancour, Jeannett Senior, MD  polyethylene glycol (MIRALAX) 17 g packet Take 17 g by mouth daily. 04/20/23   Palumbo, April, MD      Allergies    Patient has no known allergies.    Review of Systems   Review of Systems  Gastrointestinal:  Positive for abdominal pain.    Physical Exam Updated Vital Signs BP (!) 138/99 (BP Location: Left Arm)   Pulse 81   Temp 98.1 F (36.7 C) (Oral)   Resp 18   Ht 5\' 3"  (1.6 m)   Wt 90 kg   LMP 08/29/2023 (Approximate) Comment: negative u-preg 08/15/23  SpO2 100%   BMI 35.15 kg/m  Physical Exam  ED Results / Procedures / Treatments   Labs (all labs ordered are listed, but only abnormal results are displayed) Labs Reviewed  COMPREHENSIVE METABOLIC PANEL - Abnormal; Notable for the following components:      Result Value   Potassium 3.2 (*)    Calcium 8.6 (*)    All other components within normal limits  CBC - Abnormal; Notable for the following components:   Hemoglobin 10.0 (*)    HCT  32.2 (*)    MCV 77.6 (*)    MCH 24.1 (*)    All other components within normal limits  URINALYSIS, ROUTINE W REFLEX MICROSCOPIC - Abnormal; Notable for the following components:   Color, Urine ORANGE (*)    APPearance HAZY (*)    Hgb urine dipstick LARGE (*)    Protein, ur 100 (*)    All other components within normal limits  LIPASE, BLOOD  PREGNANCY, URINE  URINALYSIS, MICROSCOPIC (REFLEX)    EKG None  Radiology No results found.  Procedures Procedures  {Document cardiac monitor, telemetry assessment procedure when appropriate:1}  Medications Ordered in ED Medications - No data to display  ED Course/ Medical Decision Making/ A&P   {   Click here for ABCD2, HEART  and other calculatorsREFRESH Note before signing :1}                              Medical Decision Making Amount and/or Complexity of Data Reviewed Labs: ordered.   ***  {Document critical care time when appropriate:1} {Document review of labs and clinical decision tools ie heart score, Chads2Vasc2 etc:1}  {Document your independent review of radiology images, and any outside records:1} {Document your discussion with family members, caretakers, and with consultants:1} {Document social determinants of health affecting pt's care:1} {Document your decision making why or why not admission, treatments were needed:1} Final Clinical Impression(s) / ED Diagnoses Final diagnoses:  None    Rx / DC Orders ED Discharge Orders     None

## 2023-08-29 NOTE — ED Triage Notes (Signed)
The patient is having lower right side ABD pain for three weeks. She stated she was seen here for same.

## 2023-11-17 ENCOUNTER — Other Ambulatory Visit: Payer: Self-pay

## 2023-11-17 ENCOUNTER — Emergency Department (HOSPITAL_BASED_OUTPATIENT_CLINIC_OR_DEPARTMENT_OTHER)
Admission: EM | Admit: 2023-11-17 | Discharge: 2023-11-17 | Disposition: A | Discharge status: Home / Self Care | Payer: Medicaid Other | Attending: Emergency Medicine | Admitting: Emergency Medicine

## 2023-11-17 ENCOUNTER — Encounter (HOSPITAL_BASED_OUTPATIENT_CLINIC_OR_DEPARTMENT_OTHER): Payer: Self-pay | Admitting: Emergency Medicine

## 2023-11-17 DIAGNOSIS — L292 Pruritus vulvae: Secondary | ICD-10-CM | POA: Diagnosis present

## 2023-11-17 DIAGNOSIS — N9489 Other specified conditions associated with female genital organs and menstrual cycle: Secondary | ICD-10-CM

## 2023-11-17 LAB — PREGNANCY, URINE: Preg Test, Ur: NEGATIVE

## 2023-11-17 LAB — WET PREP, GENITAL
Clue Cells Wet Prep HPF POC: NONE SEEN
Sperm: NONE SEEN
Trich, Wet Prep: NONE SEEN
WBC, Wet Prep HPF POC: >=10 — AB (ref <10)
Yeast Wet Prep HPF POC: NONE SEEN

## 2023-11-17 NOTE — ED Triage Notes (Addendum)
Pt states that she's had vaginal itching and burning since her menstrual cycle started two days ago. Tried otc cream but caused more itching.

## 2023-11-17 NOTE — Discharge Instructions (Signed)
I do not see any obvious cause for your discomfort on exam.  The swab that we sent to the lab does not show any obvious cause either.  Please follow-up with your family doctor or an OB/GYN in the office.

## 2023-11-17 NOTE — ED Provider Notes (Signed)
[place] [facility] Provider Note   CSN: [id number] Arrival date & time: 11/17/23  0231     History  Chief Complaint  Patient presents with   Vaginal Itching    Morgan Mckenzie is a 17 y.o. female.  17 yo F with a chief complaints of vaginal burning.  Going on for about 48 hours.  She currently started her menstrual cycle as well.  Denies rash denies abdominal pain.   Vaginal Itching       Home Medications Prior to Admission medications   Medication Sig Start Date End Date Taking? Authorizing Provider  acetaminophen (TYLENOL) 325 MG tablet Take 2 tablets (650 mg total) by mouth every 6 (six) hours as needed. 06/19/23   Sloan Leiter, DO  albuterol (PROVENTIL HFA;VENTOLIN HFA) 108 (90 BASE) MCG/ACT inhaler Inhale 2-4 puffs into the lungs every 6 (six) hours as needed. For shortness of breath and wheezing     [provider]  chlorpheniramine-HYDROcodone (TUSSIONEX PENNKINETIC ER) 10-8 MG/5ML SUER Take 5 mLs by mouth every 12 (twelve) hours as needed. 01/15/16   Molpus, John, MD  fluconazole (DIFLUCAN) 150 MG tablet Take 1 tablet (150 mg total) by mouth daily. 06/02/23   Tilden Fossa, MD  fluticasone [facility]) 50 MCG/ACT nasal spray Place 1 spray into the nose daily as needed. For seasonal allergies      [provider]  ibuprofen (ADVIL) 600 MG tablet Take 1 tablet (600 mg total) by mouth every 6 (six) hours as needed. 06/19/23   Sloan Leiter, DO  loratadine (CLARITIN) 5 MG/5ML syrup Take 10 mg by mouth daily.      [provider]  norethindrone (MICRONOR) 0.35 MG tablet Take 1 tablet by mouth daily. 05/08/22   [provider]  ondansetron (ZOFRAN) 4 MG tablet Take 1 tablet (4 mg total) by mouth every 8 (eight) hours as needed for nausea or vomiting. 04/20/23   Palumbo, April, MD  ondansetron (ZOFRAN-ODT) 4 MG disintegrating tablet Take 1 tablet (4 mg total) by mouth every 8 (eight) hours as needed. 08/15/23    Rancour, Jeannett Senior, MD  polyethylene glycol (MIRALAX) 17 g packet Take 17 g by mouth daily. 04/20/23   Palumbo, April, MD      Allergies    Patient has no known allergies.    Review of Systems   Review of Systems  Physical Exam Updated Vital Signs BP (!) 137/99 (BP Location: Left Arm)   Pulse 89   Temp 98.5 F (36.9 C) (Oral)   Resp 16   Ht 5\' 2"  (1.575 m)   SpO2 100%   BMI 36.29 kg/m  Physical Exam Vitals and nursing note reviewed. Exam conducted with a chaperone present.  Constitutional:      General: She is not in acute distress.    Appearance: She is well-developed. She is not diaphoretic.  HENT:     Head: Normocephalic and atraumatic.  Eyes:     Pupils: Pupils are equal, round, and reactive to light.  Cardiovascular:     Rate and Rhythm: Normal rate and regular rhythm.     Heart sounds: No murmur heard.    No friction rub. No gallop.  Pulmonary:     Effort: Pulmonary effort is normal.     Breath sounds: No wheezing or rales.  Abdominal:     General: There is no distension.     Palpations: Abdomen is soft.     Tenderness: There is no abdominal tenderness.  Genitourinary:    Comments: Blood in the.  Slight mucousy discharge at the cervix.  No obvious cervical motion tenderness no adnexal tenderness or masses. Musculoskeletal:        General: No tenderness.     Cervical back: Normal range of motion and neck supple.  Skin:    General: Skin is warm and dry.  Neurological:     Mental Status: She is alert and oriented to person, place, and time.  Psychiatric:        Behavior: Behavior normal.     ED Results / Procedures / Treatments   Labs (all labs ordered are listed, but only abnormal results are displayed) Labs Reviewed  WET PREP, GENITAL - Abnormal; Notable for the following components:      Result Value   WBC, Wet Prep HPF POC >=10 (*)    All other components within normal limits  PREGNANCY, URINE  GC/CHLAMYDIA PROBE AMP ([place]) NOT AT [facility]     EKG None  Radiology No results found.  Procedures Procedures    Medications Ordered in ED Medications - No data to display  ED Course/ Medical Decision Making/ A&P                                 Medical Decision Making Amount and/or Complexity of Data Reviewed Labs: ordered.   17 yo F with a chief complaints of vaginal pain.  Feels like burning.  Going on for about 48 hours.  I do not see any obvious cause for her symptoms on exam.  Wet prep also without obvious cause.    Will have her follow-up with her family doctor in the office.  Encouraged follow-up with GYN as well.  3:23 AM:  I have discussed the diagnosis/risks/treatment options with the patient.  Evaluation and diagnostic testing in the emergency department does not suggest an emergent condition requiring admission or immediate intervention beyond what has been performed at this time.  They will follow up with PCP, gyn. We also discussed returning to the ED immediately if new or worsening sx occur. We discussed the sx which are most concerning (e.g., sudden worsening pain, fever, inability to tolerate by mouth) that necessitate immediate return. Medications administered to the patient during their visit and any new prescriptions provided to the patient are listed below.  Medications given during this visit Medications - No data to display   The patient appears reasonably screen and/or stabilized for discharge and I doubt any other medical condition or other [facility] requiring further screening, evaluation, or treatment in the ED at this time prior to discharge.          Final Clinical Impression(s) / ED Diagnoses Final diagnoses:  Vaginal burning    Rx / DC Orders ED Discharge Orders     None         Melene Plan, DO 11/17/23 [phone]

## 2023-11-18 LAB — GC/CHLAMYDIA PROBE AMP (~~LOC~~) NOT AT ARMC
Chlamydia: NEGATIVE
Comment: NEGATIVE
Comment: NORMAL
Neisseria Gonorrhea: NEGATIVE

## 2023-12-18 ENCOUNTER — Inpatient Hospital Stay (HOSPITAL_COMMUNITY)
Admission: AD | Admit: 2023-12-18 | Discharge: 2023-12-21 | DRG: 885 | Disposition: A | Discharge status: Home / Self Care | Payer: Medicaid Other | Source: Intra-hospital | Attending: Psychiatry | Admitting: Psychiatry

## 2023-12-18 ENCOUNTER — Encounter (HOSPITAL_COMMUNITY): Payer: Self-pay | Admitting: Behavioral Health

## 2023-12-18 ENCOUNTER — Other Ambulatory Visit: Payer: Self-pay

## 2023-12-18 DIAGNOSIS — Z5986 Financial insecurity: Secondary | ICD-10-CM

## 2023-12-18 DIAGNOSIS — F332 Major depressive disorder, recurrent severe without psychotic features: Principal | ICD-10-CM | POA: Diagnosis present

## 2023-12-18 DIAGNOSIS — R45851 Suicidal ideations: Secondary | ICD-10-CM | POA: Diagnosis present

## 2023-12-18 DIAGNOSIS — J45909 Unspecified asthma, uncomplicated: Secondary | ICD-10-CM | POA: Diagnosis present

## 2023-12-18 DIAGNOSIS — R001 Bradycardia, unspecified: Secondary | ICD-10-CM | POA: Diagnosis present

## 2023-12-18 DIAGNOSIS — Z62810 Personal history of physical and sexual abuse in childhood: Secondary | ICD-10-CM | POA: Diagnosis not present

## 2023-12-18 DIAGNOSIS — E669 Obesity, unspecified: Secondary | ICD-10-CM | POA: Diagnosis present

## 2023-12-18 DIAGNOSIS — Z825 Family history of asthma and other chronic lower respiratory diseases: Secondary | ICD-10-CM | POA: Diagnosis not present

## 2023-12-18 DIAGNOSIS — Z62820 Parent-biological child conflict: Secondary | ICD-10-CM | POA: Diagnosis not present

## 2023-12-18 DIAGNOSIS — Z818 Family history of other mental and behavioral disorders: Secondary | ICD-10-CM

## 2023-12-18 DIAGNOSIS — F17290 Nicotine dependence, other tobacco product, uncomplicated: Secondary | ICD-10-CM | POA: Diagnosis present

## 2023-12-18 DIAGNOSIS — Z6835 Body mass index (BMI) 35.0-35.9, adult: Secondary | ICD-10-CM

## 2023-12-18 DIAGNOSIS — Z9151 Personal history of suicidal behavior: Secondary | ICD-10-CM | POA: Diagnosis not present

## 2023-12-18 DIAGNOSIS — Z79899 Other long term (current) drug therapy: Secondary | ICD-10-CM | POA: Diagnosis not present

## 2023-12-18 DIAGNOSIS — F419 Anxiety disorder, unspecified: Secondary | ICD-10-CM | POA: Diagnosis present

## 2023-12-18 MED ORDER — HALOPERIDOL LACTATE 5 MG/ML IJ SOLN: 5.0000 mg | Freq: Three times a day (TID) | INTRAMUSCULAR | Status: DC | PRN

## 2023-12-18 MED ORDER — LORAZEPAM 2 MG/ML IJ SOLN: 2.0000 mg | Freq: Three times a day (TID) | INTRAMUSCULAR | Status: DC | PRN

## 2023-12-18 MED ORDER — HALOPERIDOL 5 MG PO TABS: 5.0000 mg | ORAL_TABLET | Freq: Three times a day (TID) | ORAL | Status: DC | PRN

## 2023-12-18 MED ORDER — ALBUTEROL SULFATE HFA 108 (90 BASE) MCG/ACT IN AERS: 1.0000 | INHALATION_SPRAY | RESPIRATORY_TRACT | Status: DC | PRN

## 2023-12-18 MED ORDER — DIPHENHYDRAMINE HCL 50 MG/ML IJ SOLN: 50.0000 mg | Freq: Three times a day (TID) | INTRAMUSCULAR | Status: DC | PRN

## 2023-12-18 MED ORDER — HALOPERIDOL LACTATE 5 MG/ML IJ SOLN: 10.0000 mg | Freq: Three times a day (TID) | INTRAMUSCULAR | Status: DC | PRN

## 2023-12-18 MED ORDER — MELATONIN 3 MG PO TABS
3.0000 mg | ORAL_TABLET | Freq: Every evening | ORAL | Status: DC | PRN
  Administered 2023-12-18 – 2023-12-20 (×3): 3 mg via ORAL
  Filled 2023-12-18 (×2): qty 1

## 2023-12-18 MED ORDER — DIPHENHYDRAMINE HCL 25 MG PO CAPS: 50.0000 mg | ORAL_CAPSULE | Freq: Three times a day (TID) | ORAL | Status: DC | PRN
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
## 2023-12-18 NOTE — [facility] Group Notes (Signed)
[facility] Group Notes:  (Nursing/MHT/Case Management/Adjunct)  Date:  12/18/2023  Time:  9:07 PM  Type of Therapy:   wrap-up group  Participation Level:  Did Not Attend  Participation Quality:    Affect:    Cognitive:    Insight:    Engagement in Group:    Modes of Intervention:    Summary of Progress/Problems: Refused group.  Morgan Mckenzie 12/18/2023, 9:07 PM

## 2023-12-18 NOTE — Group Note (Signed)
Date:  12/18/2023 Time:  4:55 PM  Group Topic/Focus:  Dimensions of Wellness:   The focus of this group is to introduce the topic of wellness and discuss the role each dimension of wellness plays in total health. Emotional Education:   The focus of this group is to discuss what feelings/emotions are, and how they are experienced.    Participation Level:  Did Not Attend  Participation Quality:   n/a  Affect:   n/a  Cognitive:   n/a  Insight: None  Engagement in Group:   n/a  Modes of Intervention:   n/a  Additional Comments:   Pt did not attend.  Edmund Hilda Longino Trefz 12/18/2023, 4:55 PM

## 2023-12-18 NOTE — Progress Notes (Signed)
   12/18/23 2000  Psych Admission Type (Psych Patients Only)  Admission Status Voluntary  Psychosocial Assessment  Patient Complaints Anxiety  Eye Contact Fair  Facial Expression Anxious  Affect Anxious  Speech Logical/coherent  Interaction Cautious  Motor Activity Slow  Appearance/Hygiene Unremarkable  Behavior Characteristics Cooperative  Mood Anxious  Aggressive Behavior  Effect No apparent injury  Thought Process  Coherency WDL  Content WDL  Delusions WDL  Perception WDL  Hallucination None reported or observed  Judgment WDL  Confusion WDL  Danger to Self  Current suicidal ideation? Denies  Danger to Others  Danger to Others None reported or observed

## 2023-12-18 NOTE — Plan of Care (Signed)
  Problem: Education: Goal: Emotional status will improve Outcome: Progressing Goal: Mental status will improve Outcome: Progressing   Problem: Activity: Goal: Interest or engagement in activities will improve Outcome: Progressing Goal: Sleeping patterns will improve Outcome: Progressing
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
## 2023-12-18 NOTE — Plan of Care (Signed)
  Problem: Education: Goal: Knowledge of [facility] Education information/materials will improve Outcome: Progressing Goal: Emotional status will improve Outcome: Progressing   

## 2023-12-18 NOTE — Group Note (Signed)
Date:  12/18/2023 Time:  5:16 PM  Group Topic/Focus:  Nutrition Group    Participation Level:  Did Not Attend  Participation Quality:   n/a  Affect:   n/a  Cognitive:   n/a  Insight: None  Engagement in Group:   n/a  Modes of Intervention:   n/a  Additional Comments:   Pt did not attend.  Morgan Mckenzie 12/18/2023, 5:16 PM

## 2023-12-19 ENCOUNTER — Encounter (HOSPITAL_COMMUNITY): Payer: Self-pay

## 2023-12-19 DIAGNOSIS — F332 Major depressive disorder, recurrent severe without psychotic features: Secondary | ICD-10-CM | POA: Diagnosis not present

## 2023-12-19 MED ORDER — DOCUSATE SODIUM 100 MG PO CAPS
100.0000 mg | ORAL_CAPSULE | Freq: Two times a day (BID) | ORAL | Status: DC
  Administered 2023-12-21: 100 mg via ORAL
  Filled 2023-12-19 (×8): qty 1

## 2023-12-19 MED ORDER — FERROUS SULFATE 325 (65 FE) MG PO TABS
325.0000 mg | ORAL_TABLET | Freq: Two times a day (BID) | ORAL | Status: DC
  Administered 2023-12-19 – 2023-12-21 (×4): 325 mg via ORAL
  Filled 2023-12-19 (×8): qty 1

## 2023-12-19 MED ORDER — POTASSIUM CHLORIDE CRYS ER 20 MEQ PO TBCR
40.0000 meq | EXTENDED_RELEASE_TABLET | Freq: Once | ORAL | Status: AC
Start: 2023-12-19 — End: 2023-12-19
  Administered 2023-12-19: 40 meq via ORAL
  Filled 2023-12-19 (×2): qty 2

## 2023-12-19 MED ORDER — POTASSIUM CHLORIDE CRYS ER 20 MEQ PO TBCR
40.0000 meq | EXTENDED_RELEASE_TABLET | Freq: Every day | ORAL | Status: DC
  Administered 2023-12-19 – 2023-12-20 (×2): 40 meq via ORAL
  Filled 2023-12-19 (×4): qty 2

## 2023-12-19 MED ORDER — LAMOTRIGINE 25 MG PO TABS
25.0000 mg | ORAL_TABLET | Freq: Every day | ORAL | Status: DC
  Administered 2023-12-19 – 2023-12-20 (×2): 25 mg via ORAL
  Filled 2023-12-19 (×4): qty 1
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
## 2023-12-19 NOTE — [facility] Suicide Risk Assessment (Signed)
[facility] INPATIENT:  Family/Significant Other Suicide Prevention Education  Suicide Prevention Education:  Education Completed; Roselynne Devoss (mom) [phone] (name of family member/significant other) has been identified by the patient as the family member/significant other with whom the patient will be residing, and identified as the person(s) who will aid the patient in the event of a mental health crisis (suicidal ideations/suicide attempt).  With written consent from the patient, the family member/significant other has been provided the following suicide prevention education, prior to the and/or following the discharge of the patient.  Mom said that on Wednesday (1/15), patient called her crying.  Patient told her that her dad had been venting about his problems:  he couldn't pay rent and that "will do something to himself."  Mom found patient with a Tylenol bottle in the bathroom, and patient was admitted to the hospital.  Mom said that she visited patient yesterday and spoke with her on the phone today. "She is very nice and sweet.  She sounded like she is happy."  Patient told mom that she doesn't want to take pain medications for cramps so she doesn't "mess up her liver," and she accepted liquid pain medication in the hospital.  Mom said that she doesn't have safety concerns about patient coming home upon discharge.  Mom said that there are no guns in the home.  Patient "has her own car, is really responsible, and pays her own bills.  If she needs help, me and grandma help her."  Mom said that she will secure the medications in the home.  The suicide prevention education provided includes the following: Suicide risk factors Suicide prevention and interventions National Suicide Hotline telephone number [facility] assessment telephone number [facility] Emergency Assistance 911 [facility] and/or Residential Mobile Crisis Unit telephone number  Request made of  family/significant other to: Remove weapons (e.g., guns, rifles, knives), all items previously/currently identified as safety concern.   Remove drugs/medications (over-the-counter, prescriptions, illicit drugs), all items previously/currently identified as a safety concern.  The family member/significant other verbalizes understanding of the suicide prevention education information provided.  The family member/significant other agrees to remove the items of safety concern listed above.  Sarita Hakanson O Blair Lundeen 12/19/2023, 6:19 PM
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
## 2023-12-19 NOTE — Progress Notes (Signed)
   12/19/23 0915  Psych Admission Type (Psych Patients Only)  Admission Status Voluntary  Psychosocial Assessment  Patient Complaints Anxiety  Eye Contact Fair  Facial Expression Anxious  Affect Anxious  Speech Logical/coherent  Interaction Cautious  Motor Activity Slow  Appearance/Hygiene Unremarkable  Behavior Characteristics Cooperative  Mood Anxious  Thought Process  Coherency WDL  Content WDL  Delusions None reported or observed  Perception WDL  Hallucination None reported or observed  Judgment WDL  Confusion WDL  Danger to Self  Current suicidal ideation? Denies  Agreement Not to Harm Self Yes  Description of Agreement Verbal  Danger to Others  Danger to Others None reported or observed

## 2023-12-19 NOTE — Group Note (Signed)
Recreation Therapy Group Note   Group Topic:Leisure Education  Group Date: 12/19/2023 Start Time: 0933 End Time: 1009 Facilitators: Ayse Mccartin-McCall, LRT,CTRS Location: [address] Dayroom   Group Topic: Leisure Education  Goal Area(s) Addresses:  Patient will identify positive leisure activities.  Patient will identify one positive benefit of participation in leisure activities.   Intervention: Leisure Group Game  Activity: Patient, MHT, and LRT participated in playing a trivia game of Guess the [place]. LRT and patients discussed what leisure is, what examples of leisure activities are, where you can participate in leisure and why leisure is important.   Education:  Leisure Exposure, Pharmacist, community, Discharge Planning  Education Outcome: Acknowledges education/In group clarification offered/Needs additional education   Affect/Mood: N/A   Participation Level: Did not attend    Clinical Observations/Individualized Feedback:     Plan: Continue to engage patient in RT group sessions 2-3x/week.   Jaiven Graveline-McCall, LRT,CTRS 12/19/2023 12:05 PM

## 2023-12-19 NOTE — Group Note (Signed)
Date:  12/19/2023 Time:  4:44 PM  Group Topic/Focus:  Dimensions of Wellness:   The focus of this group is to introduce the topic of wellness and discuss the role each dimension of wellness plays in total health.    Participation Level:  Did Not Attend  Participation Quality:   n/a  Affect:   n/a  Cognitive:   n/a  Insight: None  Engagement in Group:   n/a  Modes of Intervention:   n/a  Additional Comments:   Pt did not attend.  Edmund Hilda Vivika Poythress 12/19/2023, 4:44 PM

## 2023-12-19 NOTE — Progress Notes (Signed)
[facility]:  [facility]  CSW faxed a referral [facility] to [facility]" fax # [phone].  The following documents were included: referral and face sheet. H&P was not included (not available yet).  Kennet Mccort, LCSWA 12/19/2023, 4:30 PM

## 2023-12-19 NOTE — Plan of Care (Signed)
  Problem: Education: Goal: Knowledge of [facility] Education information/materials will improve Outcome: Progressing Goal: Verbalization of understanding the information provided will improve Outcome: Progressing   

## 2023-12-19 NOTE — Group Note (Signed)
Date:  12/19/2023 Time:  1:53 PM  Group Topic/Focus:  Goals Group:   The focus of this group is to help patients establish daily goals to achieve during treatment and discuss how the patient can incorporate goal setting into their daily lives to aide in recovery. Orientation:   The focus of this group is to educate the patient on the purpose and policies of crisis stabilization and provide a format to answer questions about their admission.  The group details unit policies and expectations of patients while admitted.    Participation Level:  Did Not Attend  Participation Quality:   n/a  Affect:   n/a  Cognitive:   n/a  Insight: None  Engagement in Group:   n/a  Modes of Intervention:   n/a  Additional Comments:   Pt did not attend.  Morgan Mckenzie 12/19/2023, 1:53 PM
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
## 2023-12-19 NOTE — Progress Notes (Signed)
Collateral contact - Avilyn Moad (mom) [phone]  CSW called mom and left a voicemail.    Rozalia Dino, LCSWA 12/19/2023, 5:45 PM
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
## 2023-12-19 NOTE — Progress Notes (Signed)
Pt visible in the milieu.  Interacting appropriately with staff and peers.  Attending groups.  Pt reported she had a good visit with her father this evening.  Also showed excitement while discussing future plans of attending cosmetology school.  Pt denied SI, HI, and AVH.  Pt continues to be monitored for safety.  Pt safe on unit.

## 2023-12-19 NOTE — BH IP Treatment Plan (Signed)
Interdisciplinary Treatment and Diagnostic Plan Update  12/19/2023 Time of Session: 10:55AM Koryn Fretz MRN: [id number]  Principal Diagnosis: MDD (major depressive disorder), recurrent severe, without psychosis (HCC)  Secondary Diagnoses: Principal Problem:   MDD (major depressive disorder), recurrent severe, without psychosis (HCC)   Current Medications:  Current Facility-Administered Medications  Medication Dose Route Frequency Provider Last Rate Last Admin   albuterol (VENTOLIN HFA) 108 (90 Base) MCG/ACT inhaler 1-2 puff  1-2 puff Inhalation Q4H PRN White, Patrice L, NP       haloperidol (HALDOL) tablet 5 mg  5 mg Oral TID PRN White, Patrice L, NP       And   diphenhydrAMINE (BENADRYL) capsule 50 mg  50 mg Oral TID PRN White, Patrice L, NP       haloperidol lactate (HALDOL) injection 5 mg  5 mg Intramuscular TID PRN White, Patrice L, NP       And   diphenhydrAMINE (BENADRYL) injection 50 mg  50 mg Intramuscular TID PRN White, Patrice L, NP       And   LORazepam (ATIVAN) injection 2 mg  2 mg Intramuscular TID PRN White, Patrice L, NP       haloperidol lactate (HALDOL) injection 10 mg  10 mg Intramuscular TID PRN White, Patrice L, NP       And   diphenhydrAMINE (BENADRYL) injection 50 mg  50 mg Intramuscular TID PRN White, Patrice L, NP       And   LORazepam (ATIVAN) injection 2 mg  2 mg Intramuscular TID PRN White, Patrice L, NP       lamoTRIgine (LAMICTAL) tablet 25 mg  25 mg Oral QHS Izediuno, Vincent A, MD       melatonin tablet 3 mg  3 mg Oral QHS PRN Onuoha, Chinwendu V, NP   3 mg at 12/18/23 2227   PTA Medications: Medications Prior to Admission  Medication Sig Dispense Refill Last Dose/Taking   albuterol (PROVENTIL HFA;VENTOLIN HFA) 108 (90 BASE) MCG/ACT inhaler Inhale 2-4 puffs into the lungs every 6 (six) hours as needed. For shortness of breath and wheezing    Past Month   fluticasone (FLONASE) 50 MCG/ACT nasal spray Place 1 spray into the nose daily as needed. For  seasonal allergies     Past Month   melatonin 5 MG TABS Take 5 mg by mouth at bedtime as needed.   Past Week   norethindrone (MICRONOR) 0.35 MG tablet Take 1 tablet by mouth daily.   Past Month   polyethylene glycol (MIRALAX) 17 g packet Take 17 g by mouth daily. (Patient taking differently: Take 17 g by mouth daily as needed for mild constipation, moderate constipation or severe constipation.) 14 each 0 Past Month   acetaminophen (TYLENOL) 325 MG tablet Take 2 tablets (650 mg total) by mouth every 6 (six) hours as needed. 36 tablet 0    ibuprofen (ADVIL) 600 MG tablet Take 1 tablet (600 mg total) by mouth every 6 (six) hours as needed. 30 tablet 0     Patient Stressors: Educational concerns   Financial difficulties   Marital or family conflict   Occupational concerns   Traumatic event    Patient Strengths: Ability for insight  Communication skills  Physical Health  Supportive family/friends   Treatment Modalities: Medication Management, Group therapy, Case management,  1 to 1 session with clinician, Psychoeducation, Recreational therapy.   Physician Treatment Plan for Primary Diagnosis: MDD (major depressive disorder), recurrent severe, without psychosis (HCC) Long Term Goal(s):  Short Term Goals:    Medication Management: Evaluate patient's response, side effects, and tolerance of medication regimen.  Therapeutic Interventions: 1 to 1 sessions, Unit Group sessions and Medication administration.  Evaluation of Outcomes: Not Progressing  Physician Treatment Plan for Secondary Diagnosis: Principal Problem:   MDD (major depressive disorder), recurrent severe, without psychosis (HCC)  Long Term Goal(s):     Short Term Goals:       Medication Management: Evaluate patient's response, side effects, and tolerance of medication regimen.  Therapeutic Interventions: 1 to 1 sessions, Unit Group sessions and Medication administration.  Evaluation of Outcomes: Not  Progressing   RN Treatment Plan for Primary Diagnosis: MDD (major depressive disorder), recurrent severe, without psychosis (HCC) Long Term Goal(s): Knowledge of disease and therapeutic regimen to maintain health will improve  Short Term Goals: Ability to remain free from injury will improve, Ability to verbalize frustration and anger appropriately will improve, Ability to demonstrate self-control, Ability to participate in decision making will improve, Ability to verbalize feelings will improve, Ability to disclose and discuss suicidal ideas, Ability to identify and develop effective coping behaviors will improve, and Compliance with prescribed medications will improve  Medication Management: RN will administer medications as ordered by provider, will assess and evaluate patient's response and provide education to patient for prescribed medication. RN will report any adverse and/or side effects to prescribing provider.  Therapeutic Interventions: 1 on 1 counseling sessions, Psychoeducation, Medication administration, Evaluate responses to treatment, Monitor vital signs and CBGs as ordered, Perform/monitor CIWA, COWS, AIMS and Fall Risk screenings as ordered, Perform wound care treatments as ordered.  Evaluation of Outcomes: Not Progressing   LCSW Treatment Plan for Primary Diagnosis: MDD (major depressive disorder), recurrent severe, without psychosis (HCC) Long Term Goal(s): Safe transition to appropriate next level of care at discharge, Engage patient in therapeutic group addressing interpersonal concerns.  Short Term Goals: Engage patient in aftercare planning with referrals and resources, Increase social support, Increase ability to appropriately verbalize feelings, Increase emotional regulation, Facilitate acceptance of mental health diagnosis and concerns, Facilitate patient progression through stages of change regarding substance use diagnoses and concerns, Identify triggers associated with  mental health/substance abuse issues, and Increase skills for wellness and recovery  Therapeutic Interventions: Assess for all discharge needs, 1 to 1 time with Social worker, Explore available resources and support systems, Assess for adequacy in community support network, Educate family and significant other(s) on suicide prevention, Complete Psychosocial Assessment, Interpersonal group therapy.  Evaluation of Outcomes: Not Progressing   Progress in Treatment: Attending groups: No. Participating in groups: No. Taking medication as prescribed: Medication not scheduled yet Toleration medication: Medication not scheduled yet Family/Significant other contact made: No, will contact:  consents pending Patient understands diagnosis: Yes. Discussing patient identified problems/goals with staff: Yes. Medical problems stabilized or resolved: Yes. Denies suicidal/homicidal ideation: Yes. Issues/concerns per patient self-inventory: No.  New problem(s) identified: No, Describe:  none  New Short Term/Long Term Goal(s): medication stabilization, elimination of SI thoughts, development of comprehensive mental wellness plan.    Patient Goals:  "lower my temper and anger, control impulse"  Discharge Plan or Barriers: Patient recently admitted. CSW will continue to follow and assess for appropriate referrals and possible discharge planning.    Reason for Continuation of Hospitalization: Depression Medication stabilization Suicidal ideation  Estimated Length of Stay: 5-7 days  Last 3 [place] Suicide Severity Risk Score: Flowsheet Row Admission (Current) [facility] from 11/17/2023 in [facility] Emergency Department  at [facility] ED from 08/29/2023 in [facility] Department at [facility]  C-SSRS RISK CATEGORY High Risk No Risk No Risk       Last PHQ 2/9 Scores:     No data to display          Scribe  for Treatment Team: Kathi Der, LCSWA 12/19/2023 11:44 AM

## 2023-12-20 DIAGNOSIS — F332 Major depressive disorder, recurrent severe without psychotic features: Secondary | ICD-10-CM | POA: Diagnosis not present

## 2023-12-20 LAB — BASIC METABOLIC PANEL
Anion gap: 5 (ref 5–15)
BUN: 7 mg/dL (ref 6–20)
CO2: 28 mmol/L (ref 22–32)
Calcium: 9.1 mg/dL (ref 8.9–10.3)
Chloride: 103 mmol/L (ref 98–111)
Creatinine, Ser: 0.65 mg/dL (ref 0.44–1.00)
GFR, Estimated: >60 mL/min (ref >60)
Glucose, Bld: 104 mg/dL — ABNORMAL HIGH (ref 70–99)
Potassium: 4.4 mmol/L (ref 3.5–5.1)
Sodium: 136 mmol/L (ref 135–145)

## 2023-12-20 LAB — VITAMIN B12: Vitamin B-12: 389 pg/mL (ref 180–914)

## 2023-12-20 LAB — TSH: TSH: 3.286 u[IU]/mL (ref 0.350–4.500)

## 2023-12-20 LAB — VITAMIN D 25 HYDROXY (VIT D DEFICIENCY, FRACTURES): Vit D, 25-Hydroxy: 16.29 ng/mL — ABNORMAL LOW (ref 30–100)

## 2023-12-20 NOTE — Plan of Care (Signed)
  Problem: Education: Goal: Mental status will improve Outcome: Progressing   

## 2023-12-20 NOTE — Progress Notes (Signed)
Pt is pleasant/calm on approach.  She is forward thinking to discharge and denies SI/HI/AVH. Pt reported that discharge tomorrow is bitter/sweet since she is 'just coming out of her shell,' but is also looking forward to going home at the same time.     12/20/23 2200  Psych Admission Type (Psych Patients Only)  Admission Status Voluntary  Psychosocial Assessment  Patient Complaints None  Eye Contact Brief  Facial Expression Pensive  Affect Appropriate to circumstance  Speech Logical/coherent  Interaction Assertive;Cautious  Appearance/Hygiene Unremarkable  Behavior Characteristics Cooperative;Calm  Mood Pleasant ("I'm just getting out of my shell and I have a nice roommate.  I'm happy to discharge, my mom will pick me up by 12pm tomorrow.")  Thought Process  Coherency WDL  Content WDL  Delusions None reported or observed  Perception WDL  Hallucination None reported or observed  Judgment WDL  Confusion None  Danger to Self  Current suicidal ideation? Denies  Agreement Not to Harm Self Yes  Description of Agreement Verbal  Danger to Others  Danger to Others None reported or observed

## 2023-12-20 NOTE — Progress Notes (Signed)
   12/20/23 0800  Psych Admission Type (Psych Patients Only)  Admission Status Voluntary  Psychosocial Assessment  Patient Complaints None  Eye Contact Fair  Facial Expression Other (Comment) (appropriate)  Affect Appropriate to circumstance  Speech Logical/coherent  Interaction Assertive  Motor Activity Other (Comment) (WDL)  Appearance/Hygiene Unremarkable  Behavior Characteristics Cooperative;Appropriate to situation  Mood Pleasant  Thought Process  Coherency WDL  Content WDL  Delusions None reported or observed  Perception WDL  Hallucination None reported or observed  Judgment WDL  Confusion None  Danger to Self  Current suicidal ideation? Denies  Agreement Not to Harm Self Yes  Description of Agreement Verbal  Danger to Others  Danger to Others None reported or observed

## 2023-12-20 NOTE — Plan of Care (Signed)
  Problem: Education: Goal: Emotional status will improve Outcome: Not Progressing   Problem: Activity: Goal: Interest or engagement in activities will improve Outcome: Not Progressing

## 2023-12-20 NOTE — [facility] Group Notes (Signed)
[facility] Group Notes:  (Nursing)  Date:  12/20/2023  Time:  1400  Type of Therapy:  Psychoeducational Skills  Participation Level:  Active  Participation Quality:  Appropriate and Attentive  Affect:  Appropriate  Cognitive:  Alert and Appropriate  Insight:  Appropriate and Good  Engagement in Group:  Engaged  Modes of Intervention:  Activity, Discussion, Exploration, Rapport Building, Socialization, and Support  Summary of Progress/Problems:  Shela Nevin 12/20/2023, 3:32 PM

## 2023-12-20 NOTE — [facility] Group Notes (Signed)
Type of Therapy and Topic:  Group Therapy:  Boundaries  Participation Level:  Did Not Attend   Description of Group:  Patients in this group were introduced to  3 types of healthy boundaries to address the extent to which we allow others into our lives, how we communicate, and where we draw the line when it comes to our personal space, emotions, and needs.  Different types of boundaries were defined and described, and each type was discussed with understanding.  Patients discussed what additional healthy boundaries could be helpful in their recovery and wellness after discharge in order to prevent future hospitalizations.   An emphasis was placed on following up with the discharge plan when they leave the hospital in order to continue becoming healthier and happier.    Therapeutic Goals: 1)  demonstrate and identify the type of unhealthy and healthy boundaries  2)  discuss healthy boundaries and how to address it  3)  identify the patient's current level of healthy boundaries and   4)  elicit communicating healthy boundaries and trust    Summary of Patient Progress:  NA  Therapeutic Modalities:   Psychoeducation Brief Solution-Focused Therapy
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
## 2023-12-20 NOTE — Progress Notes (Signed)
   12/20/23 0557  15 Minute Checks  Location Bedroom  Visual Appearance Calm  Behavior Sleeping  Sleep (Behavioral Health Patients Only)  Calculate sleep? (Click Yes once per 24 hr at 0600 safety check) Yes  Documented sleep last 24 hours 7

## 2023-12-20 NOTE — BHH Group Notes (Signed)
Pt did not attend goals group. 

## 2023-12-20 NOTE — Plan of Care (Signed)
  Problem: Activity: Goal: Interest or engagement in activities will improve Outcome: Progressing   Problem: Coping: Goal: Ability to verbalize frustrations and anger appropriately will improve Outcome: Progressing Goal: Ability to demonstrate self-control will improve Outcome: Progressing   

## 2023-12-21 DIAGNOSIS — F332 Major depressive disorder, recurrent severe without psychotic features: Secondary | ICD-10-CM | POA: Diagnosis not present

## 2023-12-21 MED ORDER — FERROUS SULFATE 325 (65 FE) MG PO TABS: 325.0000 mg | ORAL_TABLET | Freq: Two times a day (BID) | ORAL | 0 refills | Status: AC

## 2023-12-21 MED ORDER — LAMOTRIGINE 25 MG PO TABS: 25.0000 mg | ORAL_TABLET | Freq: Every day | ORAL | 0 refills | Status: AC

## 2023-12-21 NOTE — Group Note (Signed)
Date:  12/21/2023 Time:  9:46 AM  Group Topic/Focus:  Goals Group:   The focus of this group is to help patients establish daily goals to achieve during treatment and discuss how the patient can incorporate goal setting into their daily lives to aide in recovery. Orientation:   The focus of this group is to educate the patient on the purpose and policies of crisis stabilization and provide a format to answer questions about their admission.  The group details unit policies and expectations of patients while admitted.    Participation Level:  Active  Participation Quality:  Appropriate  Affect:  Appropriate  Cognitive:  Appropriate  Insight: Appropriate  Engagement in Group:  Developing/Improving  Modes of Intervention:  Discussion  Additional Comments:    Ronin Rehfeldt D Dimetrius Montfort 12/21/2023, 9:46 AM

## 2023-12-21 NOTE — Plan of Care (Signed)
  Problem: Education: Goal: Knowledge of [facility] Education information/materials will improve Outcome: Adequate for Discharge Goal: Emotional status will improve Outcome: Adequate for Discharge Goal: Mental status will improve 12/21/2023 0958 by Thersa Salt, RN Outcome: Adequate for Discharge 12/21/2023 0859 by Thersa Salt, RN Outcome: Progressing Goal: Verbalization of understanding the information provided will improve Outcome: Adequate for Discharge
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
## 2023-12-21 NOTE — Group Note (Signed)
Date:  12/21/2023 Time:  2:03 AM  Group Topic/Focus:  Wrap-Up Group:   The focus of this group is to help patients review their daily goal of treatment and discuss progress on daily workbooks.    Participation Level:  Active  Participation Quality:  Appropriate and Sharing  Affect:  Appropriate  Cognitive:  Appropriate  Insight: Appropriate  Engagement in Group:  Engaged  Modes of Intervention:  Activity and Socialization  Additional Comments:  Patients used wrap up group sheets as guide when sharing. Patient rated her day a 9.5/10. Patient shared her goal for today was to "stay clam, be positive, get out of my comfort zone and plan to go home", and patient shared that she did achieve her goals. Patient shared coping skills that she find most helpful is "reading and breathing/try and relax". Patient shared something that she likes about herself is "her ambition". Patient participated in group activity after sharing.   Morgan Mckenzie 12/21/2023, 2:03 AM

## 2023-12-21 NOTE — Progress Notes (Signed)
  [facility] Adult Case Management Discharge Plan :  Will you be returning to the same living situation after discharge:  Yes,  Patient will be going home At discharge, do you have transportation home?: Yes,  mom will be picking up patient Do you have the ability to pay for your medications: Yes,  Patient has [employer]  Release of information consent forms completed and in the chart;  Patient's signature needed at discharge.  Patient to Follow up at:  Follow-up Information     [facility] [employer] [facility]. Go to.   Why: Please go to this provider for group therapy and medication management services.  They are open 24/7. Contact information: [address] [place] [address] [phone]                 Next level of care provider has access to [facility] Link:no  Safety Planning and Suicide Prevention discussed: Yes,  SPE was done with Santina Evans (mom)  12/19/2023     Has patient been referred to the Quitline?: Patient refused referral for treatment  Patient has been referred for addiction treatment: Patient refused referral for treatment.  Mikell Camp O [place], LCSWA 12/21/2023, 9:54 AM

## 2023-12-21 NOTE — Progress Notes (Signed)
   12/21/23 0830  Psych Admission Type (Psych Patients Only)  Admission Status Voluntary  Psychosocial Assessment  Patient Complaints None  Eye Contact Brief  Facial Expression Pensive  Affect Appropriate to circumstance  Speech Logical/coherent  Interaction Assertive  Motor Activity Other (Comment) (WDL)  Appearance/Hygiene Unremarkable  Behavior Characteristics Cooperative;Calm  Mood Pleasant  Thought Process  Coherency WDL  Content WDL  Delusions None reported or observed  Perception WDL  Hallucination None reported or observed  Judgment WDL  Confusion None  Danger to Self  Current suicidal ideation? Denies  Agreement Not to Harm Self Yes  Description of Agreement Verbal  Danger to Others  Danger to Others None reported or observed
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
## 2023-12-21 NOTE — Progress Notes (Signed)
Patient ID: Morgan Mckenzie, female   DOB: 09/01/2006, 18 y.o.   MRN: [id number] Discharge instruction reviewed with patient and parent. Both verbalized understanding and denies any needs. Pt discharge home via private vehicle.

## 2023-12-21 NOTE — Plan of Care (Signed)
  Problem: Education: Goal: Mental status will improve Outcome: Progressing   

## 2024-06-17 ENCOUNTER — Encounter (HOSPITAL_COMMUNITY): Payer: Self-pay | Admitting: Obstetrics & Gynecology

## 2024-06-17 ENCOUNTER — Inpatient Hospital Stay (HOSPITAL_COMMUNITY)

## 2024-06-17 ENCOUNTER — Other Ambulatory Visit: Payer: Self-pay

## 2024-06-17 ENCOUNTER — Inpatient Hospital Stay (HOSPITAL_BASED_OUTPATIENT_CLINIC_OR_DEPARTMENT_OTHER)
Admission: EM | Admit: 2024-06-17 | Discharge: 2024-06-17 | Disposition: A | Discharge status: Home / Self Care | Attending: Emergency Medicine | Admitting: Emergency Medicine

## 2024-06-17 DIAGNOSIS — J45909 Unspecified asthma, uncomplicated: Secondary | ICD-10-CM | POA: Diagnosis not present

## 2024-06-17 DIAGNOSIS — O26891 Other specified pregnancy related conditions, first trimester: Secondary | ICD-10-CM

## 2024-06-17 DIAGNOSIS — O3680X0 Pregnancy with inconclusive fetal viability, not applicable or unspecified: Secondary | ICD-10-CM | POA: Diagnosis not present

## 2024-06-17 DIAGNOSIS — R1032 Left lower quadrant pain: Secondary | ICD-10-CM | POA: Insufficient documentation

## 2024-06-17 DIAGNOSIS — Z3A01 Less than 8 weeks gestation of pregnancy: Secondary | ICD-10-CM

## 2024-06-17 DIAGNOSIS — O26899 Other specified pregnancy related conditions, unspecified trimester: Secondary | ICD-10-CM

## 2024-06-17 DIAGNOSIS — R109 Unspecified abdominal pain: Secondary | ICD-10-CM | POA: Diagnosis not present

## 2024-06-17 DIAGNOSIS — Z3201 Encounter for pregnancy test, result positive: Secondary | ICD-10-CM | POA: Insufficient documentation

## 2024-06-17 LAB — COMPREHENSIVE METABOLIC PANEL WITH GFR
ALT: 16 U/L (ref 0–44)
AST: 19 U/L (ref 15–41)
Albumin: 4.3 g/dL (ref 3.5–5.0)
Alkaline Phosphatase: 71 U/L (ref 38–126)
Anion gap: 15 (ref 5–15)
BUN: 8 mg/dL (ref 6–20)
CO2: 18 mmol/L — ABNORMAL LOW (ref 22–32)
Calcium: 9.1 mg/dL (ref 8.9–10.3)
Chloride: 104 mmol/L (ref 98–111)
Creatinine, Ser: 0.61 mg/dL (ref 0.44–1.00)
GFR, Estimated: >60 mL/min (ref >60)
Glucose, Bld: 84 mg/dL (ref 70–99)
Potassium: 3.6 mmol/L (ref 3.5–5.1)
Sodium: 137 mmol/L (ref 135–145)
Total Bilirubin: 0.3 mg/dL (ref 0.0–1.2)
Total Protein: 7.5 g/dL (ref 6.5–8.1)

## 2024-06-17 LAB — CBC
HCT: 33 % — ABNORMAL LOW (ref 36.0–46.0)
Hemoglobin: 10.5 g/dL — ABNORMAL LOW (ref 12.0–15.0)
MCH: 24.4 pg — ABNORMAL LOW (ref 26.0–34.0)
MCHC: 31.8 g/dL (ref 30.0–36.0)
MCV: 76.6 fL — ABNORMAL LOW (ref 80.0–100.0)
Platelets: 376 K/uL (ref 150–400)
RBC: 4.31 MIL/uL (ref 3.87–5.11)
RDW: 15.1 % (ref 11.5–15.5)
WBC: 9.1 K/uL (ref 4.0–10.5)
nRBC: 0 % (ref 0.0–0.2)

## 2024-06-17 LAB — URINALYSIS, ROUTINE W REFLEX MICROSCOPIC
Bilirubin Urine: NEGATIVE
Glucose, UA: NEGATIVE mg/dL
Hgb urine dipstick: NEGATIVE
Ketones, ur: >=80 mg/dL — AB
Leukocytes,Ua: NEGATIVE
Nitrite: NEGATIVE
Protein, ur: NEGATIVE mg/dL
Specific Gravity, Urine: >=1.03 (ref 1.005–1.030)
pH: 6 (ref 5.0–8.0)

## 2024-06-17 LAB — PREGNANCY, URINE: Preg Test, Ur: POSITIVE — AB

## 2024-06-17 LAB — HCG, QUANTITATIVE, PREGNANCY: hCG, Beta Chain, Quant, S: 954 m[IU]/mL — ABNORMAL HIGH (ref <5)

## 2024-06-17 NOTE — MAU Provider Note (Addendum)
 Chief Complaint: Abdominal Pain   Event Date/Time   First Provider Initiated Contact with Patient 06/17/24 0510        SUBJECTIVE HPI: Morgan Mckenzie is a 18 y.o. G1P0 at [redacted]w[redacted]d by LMP who presents to maternity admissions reporting lower abdominal pain with a positive pregnancy test..  SABRATold ED provider this was her 2nd pregnancy and she had an abortion before, then told our nurse I lied.   She denies vaginal bleeding, vaginal itching/burning, urinary symptoms, h/a, dizziness, n/v, or fever/chills.     Abdominal Pain This is a new problem. The problem occurs intermittently. The quality of the pain is described as cramping.   RN Note:  Pt reports concern for lower abdominal pain over the past 3 days. No radiation. No vaginal discharge/bleeding. Associated with nausea. Had an at home + preg test today. LMP June 19th-23rd. Hx of abortion.       Morgan Mckenzie is a 18 y.o. at [redacted]w[redacted]d here in MAU reporting: here from [facility] - was seen for lower abdominal pain that feels like period cramps and had positive pregnancy test. Denies VB or abnormal discharge. Pt states she told the provider at [facility] that she's had a previous pregnancy and abortion, but reporting I lied.. this is the first time I've been pregnant.       Past Medical History:  Diagnosis Date   Asthma    Seasonal allergies    Past Surgical History:  Procedure Laterality Date   MYRINGOTOMY     Social History   Socioeconomic History   Marital status: Single    Spouse name: Not on file   Number of children: Not on file   Years of education: Not on file   Highest education level: Not on file  Occupational History   Not on file  Tobacco Use   Smoking status: Never    Passive exposure: Yes   Smokeless tobacco: Never  Vaping Use   Vaping status: Some Days   Substances: Nicotine  Substance and Sexual Activity   Alcohol use: Never   Drug use: Never   Sexual activity: Not on file  Other Topics Concern   Not on file  Social  History Narrative   Not on file   Social Drivers of Health   Financial Resource Strain: Not on file  Food Insecurity: No Food Insecurity (12/18/2023)   Hunger Vital Sign    Worried About Running Out of Food in the Last Year: Never true    Ran Out of Food in the Last Year: Never true  Transportation Needs: No Transportation Needs (12/18/2023)   PRAPARE - Administrator, Civil Service (Medical): No    Lack of Transportation (Non-Medical): No  Physical Activity: Not on file  Stress: Not on file  Social Connections: Not on file  Intimate Partner Violence: Not At Risk (12/18/2023)   Humiliation, Afraid, Rape, and Kick questionnaire    Fear of Current or Ex-Partner: No    Emotionally Abused: No    Physically Abused: No    Sexually Abused: No   No current facility-administered medications on file prior to encounter.   Current Outpatient Medications on File Prior to Encounter  Medication Sig Dispense Refill   albuterol  (PROVENTIL  HFA;VENTOLIN  HFA) 108 (90 BASE) MCG/ACT inhaler Inhale 2-4 puffs into the lungs every 6 (six) hours as needed. For shortness of breath and wheezing      ferrous sulfate  325 (65 FE) MG tablet Take 1 tablet (325 mg total) by mouth  2 (two) times daily with a meal. 60 tablet 0   lamoTRIgine  (LAMICTAL ) 25 MG tablet Take 1 tablet (25 mg total) by mouth at bedtime. 30 tablet 0   No Known Allergies  I have reviewed patient's Past Medical Hx, Surgical Hx, Family Hx, Social Hx, medications and allergies.   ROS:  Review of Systems  Gastrointestinal:  Positive for abdominal pain.   Review of Systems  Other systems negative   Physical Exam  Physical Exam Patient Vitals for the past 24 hrs:  BP Temp Temp src Pulse Resp SpO2  06/17/24 0143 (!) 143/106 98.3 F (36.8 C) Oral 100 17 99 %   Constitutional: Well-developed, well-nourished female in no acute distress.  Cardiovascular: normal rate Respiratory: normal effort GI: Abd soft, non-tender. Pos BS x  4 MS: Extremities nontender, no edema, normal ROM Neurologic: Alert and oriented x 4.  GU: Neg CVAT.  PELVIC EXAM: deferred in lieu of ultrasound LAB RESULTS Results for orders placed or performed during the hospital encounter of 06/17/24 (from the past 24 hours)  Urinalysis, Routine w reflex microscopic -Urine, Clean Catch     Status: Abnormal   Collection Time: 06/17/24  1:49 AM  Result Value Ref Range   Color, Urine YELLOW YELLOW   APPearance CLEAR CLEAR   Specific Gravity, Urine >=1.030 1.005 - 1.030   pH 6.0 5.0 - 8.0   Glucose, UA NEGATIVE NEGATIVE mg/dL   Hgb urine dipstick NEGATIVE NEGATIVE   Bilirubin Urine NEGATIVE NEGATIVE   Ketones, ur >=80 (A) NEGATIVE mg/dL   Protein, ur NEGATIVE NEGATIVE mg/dL   Nitrite NEGATIVE NEGATIVE   Leukocytes,Ua NEGATIVE NEGATIVE  Comprehensive metabolic panel     Status: Abnormal   Collection Time: 06/17/24  1:51 AM  Result Value Ref Range   Sodium 137 135 - 145 mmol/L   Potassium 3.6 3.5 - 5.1 mmol/L   Chloride 104 98 - 111 mmol/L   CO2 18 (L) 22 - 32 mmol/L   Glucose, Bld 84 70 - 99 mg/dL   BUN 8 6 - 20 mg/dL   Creatinine, Ser 9.38 0.44 - 1.00 mg/dL   Calcium 9.1 8.9 - 89.6 mg/dL   Total Protein 7.5 6.5 - 8.1 g/dL   Albumin 4.3 3.5 - 5.0 g/dL   AST 19 15 - 41 U/L   ALT 16 0 - 44 U/L   Alkaline Phosphatase 71 38 - 126 U/L   Total Bilirubin 0.3 0.0 - 1.2 mg/dL   GFR, Estimated >39 >39 mL/min   Anion gap 15 5 - 15  CBC     Status: Abnormal   Collection Time: 06/17/24  1:51 AM  Result Value Ref Range   WBC 9.1 4.0 - 10.5 K/uL   RBC 4.31 3.87 - 5.11 MIL/uL   Hemoglobin 10.5 (L) 12.0 - 15.0 g/dL   HCT 66.9 (L) 63.9 - 53.9 %   MCV 76.6 (L) 80.0 - 100.0 fL   MCH 24.4 (L) 26.0 - 34.0 pg   MCHC 31.8 30.0 - 36.0 g/dL   RDW 84.8 88.4 - 84.4 %   Platelets 376 150 - 400 K/uL   nRBC 0.0 0.0 - 0.2 %  Pregnancy, urine     Status: Abnormal   Collection Time: 06/17/24  1:51 AM  Result Value Ref Range   Preg Test, Ur POSITIVE (A)  NEGATIVE  hCG, quantitative, pregnancy     Status: Abnormal   Collection Time: 06/17/24  1:51 AM  Result Value Ref Range   hCG, Beta  Chain, Quant, S 954 (H) <5 mIU/mL       IMAGING US  OB LESS THAN 14 WEEKS WITH OB TRANSVAGINAL Result Date: 06/17/2024 CLINICAL DATA:  18 year old female with cramping in the 1st trimester of pregnancy. Estimated gestational age by LMP 4 weeks and 0 days. Quantitative beta HCG 954. EXAM: OBSTETRIC <14 WK US  AND TRANSVAGINAL OB US  TECHNIQUE: Both transabdominal and transvaginal ultrasound examinations were performed for complete evaluation of the gestation as well as the maternal uterus, adnexal regions, and pelvic cul-de-sac. Transvaginal technique was performed to assess early pregnancy. COMPARISON:  Previous pelvis ultrasound 08/15/2023. FINDINGS: Intrauterine gestational sac: Possible single, early gestational sac on series 1, image 4. Yolk sac:  Not visible Embryo:  Not visible Cardiac Activity: Not applicable MSD: 2.4 mm   4 w   6 d Subchorionic hemorrhage:  None visualized. Maternal uterus/adnexae: Moderate volume of free fluid in the cul-de-sac (image 61 of series 1), appears to be simple fluid. The left ovary is normal measuring 2.5 x 2.6 x 2.7 cm. The right ovary is larger measuring 2.8 by 3.9 x 2.3 cm and contains a simple cyst with no vascular elements detected by Doppler (series 1, image 22). IMPRESSION: 1. Probable early intrauterine gestational sac, but no yolk sac, fetal pole, or cardiac activity yet visualized. Recommend follow-up quantitative B-HCG levels and follow-up US  in 14 days to confirm and assess viability. This recommendation follows SRU consensus guidelines: Diagnostic Criteria for Nonviable Pregnancy Early in the First Trimester. LOISE Diedra PARAS Med 2013; 630:8556-48. 2. Moderate volume of simple appearing free fluid in the pelvis. Electronically Signed   By: VEAR Hurst M.D.   On: 06/17/2024 07:08     MAU Management/MDM: I have reviewed the triage  vital signs and the nursing notes.   Pertinent labs & imaging results that were available during my care of the patient were reviewed by me and considered in my medical decision making (see chart for details).      I have reviewed her medical records including past results, notes and treatments. Medical, Surgical, and family history were reviewed.  Medications and recent lab tests were reviewed  Ordered usual first trimester r/o ectopic labs.   Pelvic  cultures done Will check baseline Ultrasound to rule out ectopic. This bleeding/pain can represent a normal pregnancy with bleeding, spontaneous abortion or even an ectopic which can be life-threatening.  The process as listed above helps to determine which of these is present.    ASSESSMENT 1. Pregnancy, location unknown   Pelvic pain in pregnancy  PLAN Discharge home Plan to repeat HCG level in 48 hours in MAU  Will repeat  Ultrasound in about 7-10 days if HCG levels double appropriately  Ectopic precautions   Pt stable at time of discharge. Encouraged to return here if she develops worsening of symptoms, increase in pain, fever, or other concerning symptoms.    Earnie Pouch CNM, MSN Certified Nurse-Midwife 06/17/2024  5:10 AM

## 2024-06-17 NOTE — MAU Note (Signed)
 Phlebotomy called pt for lab draw - pt not in lobby.

## 2024-06-17 NOTE — MAU Note (Signed)
 RN called pt - registration states pt went to [employer].

## 2024-06-17 NOTE — ED Notes (Signed)
 Report called and given to [facility], RN at [facility]

## 2024-06-17 NOTE — ED Triage Notes (Signed)
 Pt reports concern for lower abdominal pain over the past 3 days. No radiation. No vaginal discharge/bleeding. Associated with nausea. Had an at home + preg test today. LMP June 19th-23rd. Hx of abortion.

## 2024-06-17 NOTE — MAU Note (Addendum)
 Morgan Mckenzie is a 18 y.o. at [redacted]w[redacted]d here in MAU reporting: here from [facility] - was seen for lower abdominal pain that feels like period cramps and had positive pregnancy test. Denies VB or abnormal discharge. Pt states she told the provider at [facility] that she's had a previous pregnancy and abortion, but reporting I lied.. this is the first time I've been pregnant.   LMP: 05/20/2024 Onset of complaint: 3 days Pain score: 7 Vitals:   06/17/24 0143 06/17/24 0523  BP: (!) 143/106 (!) 142/83  Pulse: 100 (!) 101  Resp: 17 18  Temp: 98.3 F (36.8 C) 99.1 F (37.3 C)  SpO2: 99% 99%     FHT: NA  Lab orders placed from triage: none

## 2024-06-17 NOTE — ED Provider Notes (Signed)
 [place] [facility] Provider Note   CSN: [id number] Arrival date & time: 06/17/24  0137     Patient presents with: Abdominal Pain   Morgan Mckenzie is a 18 y.o. female.   The history is provided by the patient.  Abdominal Pain Pain location:  Suprapubic and LLQ Pain radiates to:  Does not radiate Pain severity:  Moderate Onset quality:  Gradual Duration: days. Timing:  Constant Progression:  Unchanged Chronicity:  New Context: not alcohol use   Relieved by:  Nothing Worsened by:  Nothing Ineffective treatments:  None tried Associated symptoms: no anorexia, no fever, no vaginal bleeding, no vaginal discharge and no vomiting   Risk factors: pregnancy   Patient is G2P1 at [redacted] weeks pregnant based on LMP with pain in the lower abdomen.      Past Medical History:  Diagnosis Date   Asthma    Seasonal allergies      Prior to Admission medications   Medication Sig Start Date End Date Taking? Authorizing Provider  albuterol  (PROVENTIL  HFA;VENTOLIN  HFA) 108 (90 BASE) MCG/ACT inhaler Inhale 2-4 puffs into the lungs every 6 (six) hours as needed. For shortness of breath and wheezing     [provider]  ferrous sulfate  325 (65 FE) MG tablet Take 1 tablet (325 mg total) by mouth 2 (two) times daily with a meal. 12/21/23   Izediuno, Jerrell LABOR, MD  lamoTRIgine  (LAMICTAL ) 25 MG tablet Take 1 tablet (25 mg total) by mouth at bedtime. 12/21/23   Hinda Jerrell LABOR, MD    Allergies: Patient has no known allergies.    Review of Systems  Constitutional:  Negative for fever.  Gastrointestinal:  Positive for abdominal pain. Negative for anorexia and vomiting.  Genitourinary:  Negative for vaginal bleeding and vaginal discharge.  All other systems reviewed and are negative.   Updated Vital Signs BP (!) 143/106 (BP Location: Left Arm)   Pulse 100   Temp 98.3 F (36.8 C) (Oral)   Resp 17   LMP 05/24/2024   SpO2 99%   Physical  Exam Vitals and nursing note reviewed.  Constitutional:      General: She is not in acute distress.    Appearance: She is well-developed.  HENT:     Head: Normocephalic and atraumatic.     Nose: Nose normal.  Eyes:     Pupils: Pupils are equal, round, and reactive to light.  Cardiovascular:     Rate and Rhythm: Normal rate and regular rhythm.     Pulses: Normal pulses.     Heart sounds: Normal heart sounds.  Pulmonary:     Effort: Pulmonary effort is normal. No respiratory distress.     Breath sounds: Normal breath sounds.  Abdominal:     General: Bowel sounds are normal. There is no distension.     Palpations: Abdomen is soft.     Tenderness: There is no guarding or rebound.  Musculoskeletal:        General: Normal range of motion.     Cervical back: Neck supple.  Skin:    General: Skin is dry.     Capillary Refill: Capillary refill takes less than 2 seconds.     Findings: No erythema or rash.  Neurological:     General: No focal deficit present.     Deep Tendon Reflexes: Reflexes normal.  Psychiatric:        Mood and Affect: Mood normal.     (all labs ordered are listed,  but only abnormal results are displayed) Results for orders placed or performed during the hospital encounter of 06/17/24  Urinalysis, Routine w reflex microscopic -Urine, Clean Catch   Collection Time: 06/17/24  1:49 AM  Result Value Ref Range   Color, Urine YELLOW YELLOW   APPearance CLEAR CLEAR   Specific Gravity, Urine >=1.030 1.005 - 1.030   pH 6.0 5.0 - 8.0   Glucose, UA NEGATIVE NEGATIVE mg/dL   Hgb urine dipstick NEGATIVE NEGATIVE   Bilirubin Urine NEGATIVE NEGATIVE   Ketones, ur >=80 (A) NEGATIVE mg/dL   Protein, ur NEGATIVE NEGATIVE mg/dL   Nitrite NEGATIVE NEGATIVE   Leukocytes,Ua NEGATIVE NEGATIVE  Comprehensive metabolic panel   Collection Time: 06/17/24  1:51 AM  Result Value Ref Range   Sodium 137 135 - 145 mmol/L   Potassium 3.6 3.5 - 5.1 mmol/L   Chloride 104 98 - 111 mmol/L    CO2 18 (L) 22 - 32 mmol/L   Glucose, Bld 84 70 - 99 mg/dL   BUN 8 6 - 20 mg/dL   Creatinine, Ser 9.38 0.44 - 1.00 mg/dL   Calcium 9.1 8.9 - 89.6 mg/dL   Total Protein 7.5 6.5 - 8.1 g/dL   Albumin 4.3 3.5 - 5.0 g/dL   AST 19 15 - 41 U/L   ALT 16 0 - 44 U/L   Alkaline Phosphatase 71 38 - 126 U/L   Total Bilirubin 0.3 0.0 - 1.2 mg/dL   GFR, Estimated >39 >39 mL/min   Anion gap 15 5 - 15  CBC   Collection Time: 06/17/24  1:51 AM  Result Value Ref Range   WBC 9.1 4.0 - 10.5 K/uL   RBC 4.31 3.87 - 5.11 MIL/uL   Hemoglobin 10.5 (L) 12.0 - 15.0 g/dL   HCT 66.9 (L) 63.9 - 53.9 %   MCV 76.6 (L) 80.0 - 100.0 fL   MCH 24.4 (L) 26.0 - 34.0 pg   MCHC 31.8 30.0 - 36.0 g/dL   RDW 84.8 88.4 - 84.4 %   Platelets 376 150 - 400 K/uL   nRBC 0.0 0.0 - 0.2 %  Pregnancy, urine   Collection Time: 06/17/24  1:51 AM  Result Value Ref Range   Preg Test, Ur POSITIVE (A) NEGATIVE  hCG, quantitative, pregnancy   Collection Time: 06/17/24  1:51 AM  Result Value Ref Range   hCG, Beta Chain, Quant, S 954 (H) <5 mIU/mL   No results found.  None  Radiology: No results found.   Procedures   Medications Ordered in the ED - No data to display                                  Medical Decision Making Amount and/or Complexity of Data Reviewed Labs: ordered.    Details: Positive pregnancy test Beta 954.  Normal white count and platelet count,  slight anemia hemoglobin 10.5.  Normal sodium 137 and potassium 3.6  Discussion of management or test interpretation with external provider(s): Case d/w Dr. Jayne, will accept please call provider in MAU Case d/w Earnie Pouch in the MAU     Final diagnoses:  Pregnancy, location unknown   Transfer POV to MAU, EMTALA complete  ED Discharge Orders     None          Colin Norment, MD 06/17/24 0430

## 2024-06-18 LAB — ABO/RH
ABO/RH(D): O NEG
Antibody Screen: NEGATIVE

## 2024-06-19 ENCOUNTER — Other Ambulatory Visit: Payer: Self-pay

## 2024-06-19 ENCOUNTER — Inpatient Hospital Stay (HOSPITAL_COMMUNITY)
Admission: AD | Admit: 2024-06-19 | Discharge: 2024-06-19 | Disposition: A | Discharge status: Home / Self Care | Payer: Self-pay | Attending: Obstetrics and Gynecology | Admitting: Obstetrics and Gynecology

## 2024-06-19 DIAGNOSIS — Z3491 Encounter for supervision of normal pregnancy, unspecified, first trimester: Secondary | ICD-10-CM | POA: Insufficient documentation

## 2024-06-19 DIAGNOSIS — Z3A01 Less than 8 weeks gestation of pregnancy: Secondary | ICD-10-CM

## 2024-06-19 DIAGNOSIS — O200 Threatened abortion: Secondary | ICD-10-CM

## 2024-06-19 DIAGNOSIS — O020 Blighted ovum and nonhydatidiform mole: Secondary | ICD-10-CM

## 2024-06-19 LAB — HCG, QUANTITATIVE, PREGNANCY: hCG, Beta Chain, Quant, S: 2992 m[IU]/mL — ABNORMAL HIGH (ref <5)

## 2024-06-19 MED ORDER — PRENATAL PLUS 27-1 MG PO TABS: 1.0000 | ORAL_TABLET | Freq: Every day | ORAL | 11 refills | Status: AC

## 2024-06-19 NOTE — MAU Provider Note (Signed)
 hCG recheck    S [name] is a 18 y.o. G1P0 pregnant female at [redacted]w[redacted]d who presents to MAU today with complaint of needing repeat hCG check. Presented on 7/17 for menses like cramping in setting of +HPT, no VB or abnormal dc.  On 7/17 hcg level 954, TVUS with probable early intrauterine GS w/o YS/FP/cardiac activity, moderate simple appearing free fluid in pelvis. Pt did state that she had some minimal rose-gold colored spotting 7/17 but no VB or abdominal pain since and none today.   Pertinent items noted in HPI and remainder of comprehensive ROS otherwise negative.   O BP 129/84 (BP Location: Right Arm)   Pulse 78   Temp 98.3 F (36.8 C)   Resp 16   LMP 05/20/2024   SpO2 100%  Physical Exam Constitutional:      General: She is not in acute distress.    Appearance: Normal appearance. She is obese. She is not ill-appearing.  HENT:     Head: Normocephalic and atraumatic.     Right Ear: External ear normal.     Left Ear: External ear normal.     Nose: Nose normal. No congestion or rhinorrhea.     Mouth/Throat:     Mouth: Mucous membranes are moist.     Pharynx: Oropharynx is clear.  Eyes:     Extraocular Movements: Extraocular movements intact.     Conjunctiva/sclera: Conjunctivae normal.  Cardiovascular:     Rate and Rhythm: Normal rate.  Pulmonary:     Effort: Pulmonary effort is normal. No respiratory distress.  Abdominal:     General: Abdomen is flat. There is no distension.     Palpations: Abdomen is soft.     Tenderness: There is no abdominal tenderness.  Musculoskeletal:        General: No swelling. Normal range of motion.     Cervical back: Normal range of motion.  Skin:    General: Skin is warm and dry.  Neurological:     Mental Status: She is alert and oriented to person, place, and time. Mental status is at baseline.     Motor: No weakness.     Gait: Gait normal.  Psychiatric:        Mood and Affect: Mood normal.        Behavior: Behavior normal.       MDM: MAU Course:  hCG 954 > 2992  AP #[redacted] weeks gestation #Intrauterine Gestational sac #Appropriate hCG - rising appropriately  - continue to follow up with desired OBGYN in [place] for viability US  as previously recommended  Discharge from MAU in stable condition with strict/usual precautions Follow up at desired OBGYN as scheduled for ongoing prenatal care  Allergies as of 06/19/2024   No Known Allergies      Medication List     TAKE these medications    albuterol  108 (90 Base) MCG/ACT inhaler Commonly known as: VENTOLIN  HFA Inhale 2-4 puffs into the lungs every 6 (six) hours as needed. For shortness of breath and wheezing   ferrous sulfate  325 (65 FE) MG tablet Take 1 tablet (325 mg total) by mouth 2 (two) times daily with a meal.   lamoTRIgine  25 MG tablet Commonly known as: LAMICTAL  Take 1 tablet (25 mg total) by mouth at bedtime.   prenatal vitamin w/FE, FA 27-1 MG Tabs tablet Take 1 tablet by mouth daily at 12 noon.        Jhonny Augustin BROCKS, MD 06/19/2024 10:28 AM

## 2024-06-19 NOTE — Discharge Instructions (Signed)

## 2024-06-19 NOTE — MAU Note (Signed)
.  Morgan Mckenzie is a 18 y.o. at [redacted]w[redacted]d here in MAU reporting: reports rose gold color spotting the day before yesterday. Is here for follow up   Pain score: denies  There were no vitals filed for this visit.    Lab orders placed from triage:   HCG quant

## 2024-06-21 NOTE — Telephone Encounter (Signed)
 Patient scheduled.

## 2024-06-21 NOTE — Telephone Encounter (Signed)
 Chart reviewed, HCG rise was appropriate. Plan for routine NOB with sonogram. Normal early pregnancy precautions for the interim.

## 2024-06-21 NOTE — Telephone Encounter (Signed)
 Patient called and states that she was seen at [facility] (notes in chart) on 06/17/24 and 06/19/24, has hcgs drawn and was told to call her ob gyn to schedule viability appointment, can you please advise what this patient needs.

## 2024-09-16 NOTE — Progress Notes (Signed)
 Doing well no lof or bleeding Reports vaginal discharge with odor Vaginal panel done Sono for anatomy next apt

## 2024-10-07 NOTE — Progress Notes (Signed)
 Doing well no lof or bleeding Sono for anatomy normal Discussed circumvallate placenta Serial sono planned Declines msafp

## 2024-11-02 ENCOUNTER — Emergency Department (HOSPITAL_BASED_OUTPATIENT_CLINIC_OR_DEPARTMENT_OTHER)
Admission: EM | Admit: 2024-11-02 | Discharge: 2024-11-03 | Disposition: A | Attending: Emergency Medicine | Admitting: Emergency Medicine

## 2024-11-02 ENCOUNTER — Other Ambulatory Visit: Payer: Self-pay

## 2024-11-02 DIAGNOSIS — K0889 Other specified disorders of teeth and supporting structures: Secondary | ICD-10-CM | POA: Diagnosis present

## 2024-11-02 NOTE — ED Triage Notes (Signed)
 Upper left dental pain X 1 week.

## 2024-11-03 MED ORDER — AMOXICILLIN-POT CLAVULANATE 875-125 MG PO TABS
1.0000 | ORAL_TABLET | Freq: Once | ORAL | Status: AC
  Administered 2024-11-03: 1 via ORAL
  Filled 2024-11-03: qty 1

## 2024-11-03 MED ORDER — HYDROCODONE-ACETAMINOPHEN 5-325 MG PO TABS
1.0000 | ORAL_TABLET | Freq: Once | ORAL | Status: AC
  Administered 2024-11-03: 1 via ORAL
  Filled 2024-11-03: qty 1

## 2024-11-03 MED ORDER — HYDROCODONE-ACETAMINOPHEN 5-325 MG PO TABS: 1.0000 | ORAL_TABLET | Freq: Four times a day (QID) | ORAL | 0 refills | Status: AC | PRN

## 2024-11-03 MED ORDER — AMOXICILLIN-POT CLAVULANATE 875-125 MG PO TABS: 1.0000 | ORAL_TABLET | Freq: Two times a day (BID) | ORAL | 0 refills | Status: AC

## 2024-11-03 NOTE — ED Provider Notes (Signed)
 [place] [facility]  Provider Note  CSN: [id number] Arrival date & time: 11/02/24 2341  History Chief Complaint  Patient presents with   Dental Pain    Morgan Mckenzie is a 18 y.o. female who is approx [redacted]wks pregnant reports about a week of L upper 1st molar pain. Had a dentist appt today but could not get off work to go. No fever or drainage.    Home Medications Prior to Admission medications   Medication Sig Start Date End Date Taking? Authorizing Provider  amoxicillin-clavulanate (AUGMENTIN) 875-125 MG tablet Take 1 tablet by mouth every 12 (twelve) hours. 11/03/24  Yes Roselyn Carlin NOVAK, MD  HYDROcodone-acetaminophen  (NORCO/VICODIN) 5-325 MG tablet Take 1 tablet by mouth every 6 (six) hours as needed for severe pain (pain score 7-10). 11/03/24  Yes Roselyn Carlin NOVAK, MD  albuterol  (PROVENTIL  HFA;VENTOLIN  HFA) 108 (90 BASE) MCG/ACT inhaler Inhale 2-4 puffs into the lungs every 6 (six) hours as needed. For shortness of breath and wheezing     [provider]  ferrous sulfate  325 (65 FE) MG tablet Take 1 tablet (325 mg total) by mouth 2 (two) times daily with a meal. 12/21/23   Izediuno, Jerrell LABOR, MD  lamoTRIgine  (LAMICTAL ) 25 MG tablet Take 1 tablet (25 mg total) by mouth at bedtime. 12/21/23   Hinda Jerrell LABOR, MD  prenatal vitamin w/FE, FA (PRENATAL 1 + 1) 27-1 MG TABS tablet Take 1 tablet by mouth daily at 12 noon. 06/19/24   Jhonny Augustin BROCKS, MD     Allergies    Patient has no known allergies.   Review of Systems   Review of Systems Please see HPI for pertinent positives and negatives  Physical Exam BP 117/76 (BP Location: Right Arm)   Pulse 88   Temp 98.4 F (36.9 C) (Oral)   Resp 20   Ht 5' 2 (1.575 m)   Wt 95.3 kg   LMP 05/20/2024   SpO2 100%   BMI 38.41 kg/m   Physical Exam Vitals and nursing note reviewed.  HENT:     Head: Normocephalic.     Nose: Nose normal.     Mouth/Throat:     Comments: Tenderness over  tooth #14, no signs of drainable abscess or deep tissue infection Eyes:     Extraocular Movements: Extraocular movements intact.  Pulmonary:     Effort: Pulmonary effort is normal.  Musculoskeletal:        General: Normal range of motion.     Cervical back: Neck supple.  Skin:    Findings: No rash (on exposed skin).  Neurological:     Mental Status: She is alert and oriented to person, place, and time.  Psychiatric:        Mood and Affect: Mood normal.     ED Results / Procedures / Treatments   EKG None  Procedures Procedures  Medications Ordered in the ED Medications  HYDROcodone-acetaminophen  (NORCO/VICODIN) 5-325 MG per tablet 1 tablet (has no administration in time range)  amoxicillin-clavulanate (AUGMENTIN) 875-125 MG per tablet 1 tablet (has no administration in time range)    Initial Impression and Plan  Patient here with uncomplicated toothache. Given pregnancy would avoid NSAIDs, Rx for norco and augmentin. Recommend outpatient dental follow up for definitive care.   ED Course       MDM Rules/Calculators/A&P Medical Decision Making Problems Addressed: Toothache: acute illness or injury  Risk Prescription drug management.     Final Clinical Impression(s) / ED  Diagnoses Final diagnoses:  Toothache    Rx / DC Orders ED Discharge Orders          Ordered    HYDROcodone-acetaminophen  (NORCO/VICODIN) 5-325 MG tablet  Every 6 hours PRN        11/03/24 0009    amoxicillin-clavulanate (AUGMENTIN) 875-125 MG tablet  Every 12 hours        11/03/24 0009             Roselyn Carlin NOVAK, MD 11/03/24 0009
